# Patient Record
Sex: Male | Born: 1975 | Race: White | Hispanic: No | Marital: Married | Smoking: Former smoker
Health system: Southern US, Community
[De-identification: ages and names within clinical notes are randomized; demographics above are authoritative.]

## PROBLEM LIST (undated history)

## (undated) DIAGNOSIS — U071 COVID-19: Secondary | ICD-10-CM

## (undated) DIAGNOSIS — K760 Fatty (change of) liver, not elsewhere classified: Secondary | ICD-10-CM

## (undated) DIAGNOSIS — K219 Gastro-esophageal reflux disease without esophagitis: Secondary | ICD-10-CM

## (undated) HISTORY — DX: Fatty (change of) liver, not elsewhere classified: K76.0

## (undated) HISTORY — DX: COVID-19: U07.1

## (undated) HISTORY — PX: COLONOSCOPY: SHX174

## (undated) HISTORY — PX: VASECTOMY: SHX75

## (undated) HISTORY — DX: Gastro-esophageal reflux disease without esophagitis: K21.9

---

## 2001-01-02 ENCOUNTER — Emergency Department (HOSPITAL_COMMUNITY): Admission: EM | Admit: 2001-01-02 | Discharge: 2001-01-02 | Payer: Self-pay | Admitting: *Deleted

## 2008-06-11 ENCOUNTER — Ambulatory Visit: Payer: Self-pay | Admitting: Gastroenterology

## 2008-06-11 DIAGNOSIS — K625 Hemorrhage of anus and rectum: Secondary | ICD-10-CM | POA: Insufficient documentation

## 2008-06-14 ENCOUNTER — Encounter: Payer: Self-pay | Admitting: Gastroenterology

## 2008-06-14 ENCOUNTER — Ambulatory Visit: Payer: Self-pay | Admitting: Gastroenterology

## 2008-06-17 ENCOUNTER — Telehealth: Payer: Self-pay | Admitting: Gastroenterology

## 2008-06-20 ENCOUNTER — Encounter: Payer: Self-pay | Admitting: Gastroenterology

## 2008-07-05 ENCOUNTER — Ambulatory Visit: Payer: Self-pay | Admitting: Gastroenterology

## 2008-07-05 DIAGNOSIS — K5150 Left sided colitis without complications: Secondary | ICD-10-CM | POA: Insufficient documentation

## 2008-10-23 ENCOUNTER — Telehealth: Payer: Self-pay | Admitting: Gastroenterology

## 2008-10-24 ENCOUNTER — Ambulatory Visit: Payer: Self-pay | Admitting: Internal Medicine

## 2008-10-30 ENCOUNTER — Telehealth: Payer: Self-pay | Admitting: Gastroenterology

## 2008-11-13 ENCOUNTER — Ambulatory Visit: Payer: Self-pay | Admitting: Gastroenterology

## 2008-11-29 ENCOUNTER — Encounter: Payer: Self-pay | Admitting: Gastroenterology

## 2009-03-05 ENCOUNTER — Telehealth: Payer: Self-pay | Admitting: Gastroenterology

## 2009-03-18 ENCOUNTER — Ambulatory Visit: Payer: Self-pay | Admitting: Gastroenterology

## 2009-08-07 ENCOUNTER — Telehealth: Payer: Self-pay | Admitting: Gastroenterology

## 2010-05-19 NOTE — Medication Information (Signed)
Summary: Approved for [employer]da[employer]  Approved for [employer]   Imported By: Phillis Knack 12/05/2008 14:05:47  _____________________________________________________________________  External Attachment:    Type:   Image     Comment:   External Document

## 2010-05-19 NOTE — Procedures (Signed)
Summary: Colonoscopy   Colonoscopy  Procedure date:  06/14/2008  Findings:      Location:  [facility].    COLONOSCOPY PROCEDURE REPORT  PATIENT:  Edward Dougherty, Edward Dougherty  MR#:  [id number] BIRTHDATE:   February 09, 1976   GENDER:   male  ENDOSCOPIST:   Sandy Salaam. Deatra Ina, MD Referred by:   PROCEDURE DATE:  06/14/2008 PROCEDURE:  Colonoscopy with biopsy ASA CLASS:   Class I INDICATIONS: hematochezia   MEDICATIONS:    Fentanyl 75 mcg, Versed 8 mg  DESCRIPTION OF PROCEDURE:   After the risks benefits and alternatives of the procedure were thoroughly explained, informed consent was obtained.  Digital rectal exam was performed and revealed no abnormalities.   The LB [id number] endoscope was introduced through the anus and advanced to the cecum, which was identified by both the appendix and ileocecal valve, without limitations.  The quality of the prep was excellent, using MoviPrep.  The instrument was then slowly withdrawn as the colon was fully examined. <<PROCEDUREIMAGES>>                              <<OLD IMAGES>>  FINDINGS:  There were mucosal changes consistent with left-sided ulcerative colitis (see image11, image12, image13, image16, image17, image15, and image18). Moderately severe colitis with mucosal friability, superficial erosions, mucus extending 25cm from rectal vault to proximal sigmoid, Biopsies were taken  This was otherwise a normal examination (see image1, image2, image3, image4, image5, image6, image7, image8, and image10).   Retroflexion was not performed.  The scope was then withdrawn from the patient and the procedure completed.  COMPLICATIONS:   None  ENDOSCOPIC IMPRESSION:  1) Colitis - left UC  2) Otherwise normal examination RECOMMENDATIONS:  1) follow-up: office 3 week(s)  Lialda 2.4gm daily  Rowasa enemas Qam  cc Gerald:   No   _______________________________ Sandy Salaam. Deatra Ina,  MD  :      [facility] [address] [place], [place] [address] Telephone [phone] or [phone] Fax [phone]   REPORT OF SURGICAL PATHOLOGY   Case #: [id number] Patient Name: Edward Dougherty Office Chart Number:  [id number]   MRN: [id number] Pathologist: Chrystie Nose. 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      COMMENT The biopsies show increased infiltrate in the lamina propria associated with active neutrophilic cryptitis and abnormal crypt architecture.  Some of the crypts show slight nuclear stratification and increased numbers of mitotic figures consistent with reactive and inflammatory changes.  The findings are consistent with chronic active inflammatory bowel disease and the left sided distribution is compatible with ulcerative colitis.     (JDP:mw 06/18/08)    mw Date Reported:  06/18/2008     Chrystie Nose. Saralyn Pilar, MD *** Electronically Signed Out By JDP ***    June 20, 2008 MRN: [id number]    [address] [place], [place]  [address]    Dear Mr. Lanuza,  I am pleased to inform you that the biopsies taken during your recent colonoscopy did not show any evidence of cancer upon pathologic examination. Inflammatory changes only were seen.  Additional information/recommendations:  __No further action is needed at this time.  Please follow-up with  your primary care physician for your other healthcare needs.  __Please call [phone] to schedule a return visit to review      your condition.  _x_Continue with the treatment plan as outlined on the day of your      exam.  __You should have a repeat colonoscopy examination for this problem           in _ years.  Please call us if you are having  persistent problems or have questions about your condition that have not been fully answered at this time.  Sincerely,  Edward Castle MD   This letter has been electronically signed by your physician.    Signed by Edward Castle MD on 06/20/2008 at 11:28 AM  This report was created from the original endoscopy report, which was reviewed and signed by the above listed endoscopist.

## 2010-05-19 NOTE — Assessment & Plan Note (Signed)
Summary: f/u .Marland Kitchen..em   History of Present Illness Visit Type: Follow-up Visit Primary GI MD: Erskine Emery MD [facility] Primary Provider: Fraser Din, MD  Chief Complaint: Colitis has greatly improved History of Present Illness:   Edward Dougherty returns for followup of his left-sided colitis.  He feels that he is slowly improving.  For financial reasons he did not purchased ProctoFoam or Rowasa.  He typically sleeps through the night 70% of the time.  He may awaken with urgency.  In the course of the day he may have 3-4 bowel movements which can be loose or well-formed.  He has a minimal amount of bleeding.  He is under stress due  to his unemployment.  Altogether, however, he feels that he is better.   GI Review of Systems    Reports belching.      Denies abdominal pain, acid reflux, bloating, chest pain, dysphagia with liquids, dysphagia with solids, heartburn, loss of appetite, nausea, vomiting, vomiting blood, weight loss, and  weight gain.      Reports rectal bleeding.     Denies anal fissure, black tarry stools, change in bowel habit, constipation, diarrhea, diverticulosis, fecal incontinence, heme positive stool, hemorrhoids, irritable bowel syndrome, jaundice, light color stool, liver problems, and  rectal pain.    Current Medications (verified): 1)  Prilosec Otc 20 Mg  Tbec (Omeprazole Magnesium) .Marland Kitchen.. 1 Each Day 30 Minutes Before Meal 2)  Lialda 1.2 Gm  Tbec (Mesalamine) .... Take 2 Tablets By Mouth Two Times A Day 3)  Proctofoam 1 % Foam (Pramoxine Hcl) .... Take Q.a.m.  Allergies (verified): 1)  Pcn  Past History:  Past Surgical History: Reviewed history from 06/11/2008 and no changes required. Unremarkable  Family History: Reviewed history from 07/05/2008 and no changes required. Family History of Diabetes: Mother, Father No FH of Colon Cancer:  Social History: Reviewed history from 06/11/2008 and no changes required. 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 Quit 10 years ago Alcohol Use - yes- few times a month Daily Caffeine Use 3-4 times week Illicit Drug Use - no  Review of Systems       The patient complains of allergy/sinus, cough, fatigue, itching, and skin rash.    Vital Signs:  Patient profile:   35 year old male Height:      72 inches Weight:      202.38 pounds BMI:     27.55 Pulse rate:   80 / minute Pulse rhythm:   regular BP sitting:   110 / 82  (left arm) Cuff size:   regular  Vitals Entered By: June McMurray CMA Deborra Medina) (March 18, 2009 10:26 AM)   Impression & Recommendations:  Problem # 1:  LEFT SIDED ULCERATIVE COLITIS (ICD-556.5) Assessment Improved Patient will continue lialda.  I have recommended that he take Imodium 2 tabs q.h.s. and then p.r.n.  He noticed a slight rash under his eyes and forehead that may have improved when he held his lialda.  Since the rash is minimal he will continue with this medicine.  Patient Instructions: 1)  cc  Edward Hey, MD

## 2010-05-19 NOTE — Progress Notes (Signed)
Summary: REV SCHEDULED  Phone Note Outgoing Call   Call placed by: Vivia Ewing LPN,  June 17, 5641 12:12 PM Call placed to: Patient Summary of Call: Pt. scheduled for REV with Dr.Kaplan on 07-15-08 at 9:45am. Meds. sent to pharmacy. Pt. instructed to call back as needed.  Initial call taken by: Vivia Ewing LPN,  June 17, 3293 12:12 PM    New/Updated Medications: LIALDA 1.2 GM  TBEC (MESALAMINE) Take 2 tabs once daily MESALAMINE 4 GM ENEM (MESALAMINE) use one every morning   Prescriptions: MESALAMINE 4 GM ENEM (MESALAMINE) use one every morning  #30 x 2   Entered by:   Vivia Ewing LPN   Authorized by:   [name] MD   Signed by:   Vivia Ewing LPN on 18/84/1660   Method used:   Electronically to        [facility] (retail)       [address]       [place], [place]  [address]       Ph: [phone]       Fax: [phone]   RxID:   [id number] LIALDA 1.2 GM  TBEC (MESALAMINE) Take 2 tabs once daily  #60 x 6   Entered by:   Vivia Ewing LPN   Authorized by:   [name] MD   Signed by:   Vivia Ewing LPN on 73/71/0626   Method used:   Electronically to        [facility] (retail)       [address]       [place], [place]  [address]       Ph: [phone]       Fax: [phone]   RxID:   [id number]

## 2010-05-19 NOTE — Progress Notes (Signed)
Summary: TRIAGE-UC FLARE  Phone Note Call from Patient Call back at Home Phone [phone]   Caller: Patient Call For: KAPLAN  Reason for Call: Talk to Nurse Summary of Call: having UC flare-ups meds not strong enough  Initial call taken by: Quenton Fetter [facility],  October 23, 2008 1:29 PM  Follow-up for Phone Call        Pt. has UC.  Pt. has been having a flare-up, off and on since 06/2008. He restarted the Mesalamine Enemas, went through 32 enemas. After a few weeks of the enemas and Lialda 2 QAM, he noted more formed stools and no blood. Now he is in another flare for about 2-3 weeks.  Currently he is having loose to watery stools, worse during the night and early mornings. Sees blood & mucus with BM's. Some abd. pain/pressure.  1) Clear liquids x24 hours. Advance to soft,bland diet x2-4 days. Advanced as tolerated. 2) See Tye Savoy NP on 10-24-08 at 10am 3) Pt. instructed to call back as needed.   Follow-up by: Vivia Ewing LPN,  October 23, 8240 3:53 PM  Additional Follow-up for Phone Call Additional follow up Details #1::        ok Additional Follow-up by: [name] MD,  October 23, 2008 4:48 PM

## 2010-05-19 NOTE — Letter (Signed)
Summary: Patient Notice- Colon Biospy Results  [facility]  [address] [place], [place] [address]   Phone: [phone]  Fax: [phone]        June 20, 2008 MRN: [id number]    Edward Dougherty [place], [place]  [address]    Dear [name],  I am pleased to inform you that the biopsies taken during your recent colonoscopy did not show any evidence of cancer upon pathologic examination. Inflammatory changes only were seen.  Additional information/recommendations:  __No further action is needed at this time.  Please follow-up with      your primary care physician for your other healthcare needs.  __Please call [phone] to schedule a return visit to review      your condition.  _x_Continue with the treatment plan as outlined on the day of your      exam.  __You should have a repeat colonoscopy examination for this problem           in _ years.  Please call us if you are having persistent problems or have questions about your condition that have not been fully answered at this time.  Sincerely,  [name] MD   This letter has been electronically signed by your physician.

## 2010-05-19 NOTE — Progress Notes (Signed)
Summary: CONDITION UPDATE  Phone Note Call from Patient Call back at Home Phone [phone]   Caller: Patient Call For: KAPLAN  Reason for Call: Talk to Nurse Summary of Call: meds are not working  Initial call taken by: Quenton Fetter [facility],  October 30, 2008 9:56 AM  Follow-up for Phone Call        Pt. was seen 10-24-08 and has been taking Lialda #4 daily. Continues with abd. pressure, loose to watery stools, 10-12 stools daily,  passes only gas/blood at times. Blood and mucus with every BM. Denies n/v, fever.   DR.KAPLAN PLEASE ADVISE   Follow-up by: Vivia Ewing LPN,  October 30, 8869 9:59 PM  Additional Follow-up for Phone Call Additional follow up Details #1::        Cort enemas qam Rowasa enema at bedtime c/b mon if no better ov 1-2wks Additional Follow-up by: [name] MD,  October 31, 2008 9:35 AM    Additional Follow-up for Phone Call Additional follow up Details #2::    Message left for pt. with above MD instructions. Meds. to pharmacy. Pt. instructed to call back as needed.   Follow-up by: Vivia Ewing LPN,  October 31, 7469 8:55 AM  New/Updated Medications: MESALAMINE 4 GM ENEM (MESALAMINE) use one every night at bedtime CORTENEMA 100 MG/60ML ENEM (HYDROCORTISONE) Use 1 every morning Prescriptions: CORTENEMA 100 MG/60ML ENEM (HYDROCORTISONE) Use 1 every morning  #30 x 1   Entered by:   Vivia Ewing LPN   Authorized by:   [name] MD   Signed by:   Vivia Ewing LPN on 01/58/6825   Method used:   Electronically to        [facility] (retail)       [address]       [place], [place]  [address]       Ph: [phone]       Fax: [phone]   RxID:   [id number] MESALAMINE 4 GM ENEM (MESALAMINE) use one every night at bedtime  #30 x 1   Entered by:   Vivia Ewing LPN   Authorized by:   [name] MD   Signed by:   Vivia Ewing LPN on 88/64/8472   Method used:   Electronically to        [facility] (retail)       [address]       [place], [place]  [address]       Ph: [phone]       Fax: [phone]   RxID:   [id number]

## 2010-05-19 NOTE — Progress Notes (Signed)
Summary: refill  Phone Note Call from Patient Call back at Home Phone [phone]   Caller: Patient Call For: Dr. Deatra Ina Reason for Call: Talk to Nurse Summary of Call: no more refills for Lialda... wants to know if he needs to be seen in office before he can get a refill... wants to know if dosage needs to be adjusted, although pt has no complaints and says meds are really helping...  [facility] in [place] Initial call taken by: Lucien Mons,  August 07, 2009 9:40 AM  Follow-up for Phone Call        Called pt to inform he was seen in Nov. 2010 gave pt 6 refills told him to schedule office appointment before November for any further refills. Pt had no GI complaints Follow-up by: Genella Mech CMA Deborra Medina),  August 07, 2009 10:37 AM    Prescriptions: LIALDA 1.2 GM  TBEC (MESALAMINE) Take 2 tablets by mouth two times a day  #120 x 6   Entered by:   Genella Mech CMA ([place])   Authorized by:   [name] MD   Signed by:   Genella Mech CMA ([place]) on 08/07/2009   Method used:   Electronically to        [facility] (retail)       [address]       [place],   [address]       Ph: [phone]       Fax: [phone]   RxID:   [id number]

## 2010-05-19 NOTE — Progress Notes (Signed)
Summary: ? re refills  Phone Note Call from Patient Call back at Home Phone [phone]   Caller: Patient Call For: [facility] Reason for Call: Talk to Nurse Summary of Call: Patient has questions regarding his refills on Lialda Initial call taken by: Ronalee Red,  March 05, 2009 9:54 AM  Follow-up for Phone Call        [facility] to verify Lialda refills per pts request. called pt to inform that I called [place] Follow-up by: Genella Mech CMA Deborra Medina),  March 05, 2009 10:31 AM

## 2010-05-19 NOTE — Assessment & Plan Note (Signed)
Summary: ULCERATIVE COLITIS FLARE             (DR.KAPLAN PT)    Edward Dougherty   History of Present Illness Visit Type: Follow-up Visit Primary GI MD: Erskine Emery MD [facility] Primary Nikkole Placzek: Ricke Hey, MD Chief Complaint: Ulcerative colitis flaring now History of Present Illness:   Edward Dougherty was diagnosed with left sided UC in Feb. 2010. He was initially on Lialda and Rowasa enemas but at his last follow-up in March Rowasa was stopped because patient was doing much better.  After March appt. symptoms gradually returned.  In May patient restarted himself on Rowasa enemas. Symptomsresolved and in mid June he stopped Rowasa enemas because symptoms resolved. Since being off Rowasa again [name]'s symptoms gave gradully returned. Stools are not necessarily loose but he is having tenesmus and over the last few days stools have been mucoid and blood tinged. No fever. No abdominal pain. No joint aches. No skin rashes except around mouth where he had been eating Mangos.     GI Review of Systems      Denies abdominal pain, acid reflux, belching, bloating, chest pain, dysphagia with liquids, dysphagia with solids, heartburn, loss of appetite, nausea, vomiting, vomiting blood, weight loss, and  weight gain.      Reports rectal bleeding.     Denies anal fissure, black tarry stools, change in bowel habit, constipation, diarrhea, diverticulosis, fecal incontinence, heme positive stool, hemorrhoids, irritable bowel syndrome, jaundice, light color stool, liver problems, and  rectal pain.    Current Medications (verified): 1)  Prilosec Otc 20 Mg  Tbec (Omeprazole Magnesium) .Marland Kitchen.. 1 Each Day 30 Minutes Before Meal 2)  Lialda 1.2 Gm  Tbec (Mesalamine) .... Take 2 Tabs Once Daily 3)  Mesalamine 4 Gm Enem (Mesalamine) .... Use One Every Morning  Allergies (verified): 1)  Pcn  Past History:  Past Medical History: Ulcerative Colitis  Past Surgical History: Reviewed history from 06/11/2008 and no changes  required. Unremarkable  Family History: Reviewed history from 07/05/2008 and no changes required. Family History of Diabetes: Mother, Father No FH of Colon Cancer:  Social History: Reviewed history from 06/11/2008 and no changes required. 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  The patient denies anemia, anxiety-new, arthritis/joint pain, back pain, blood in urine, breast changes/lumps, change in vision, confusion, cough, coughing up blood, depression-new, fainting, fever, headaches-new, hearing problems, heart murmur, heart rhythm changes, itching, muscle pains/cramps, night sweats, nosebleeds, pregnancy symptoms, shortness of breath, sleeping problems, sore throat, swelling of feet/legs, swollen lymph glands, thirst - excessive , urination - excessive , urination changes/pain, urine leakage, vision changes, and voice change.    Vital Signs:  Patient profile:   35 year old male Height:      72 inches Weight:      207.38 pounds BMI:     28.23 Pulse rate:   72 / minute Pulse rhythm:   regular BP sitting:   120 / 82  (left arm) Cuff size:   regular  Vitals Entered By: June McMurray Stow (October 24, 2008 10:10 AM)  Physical Exam  General:  Well developed, well nourished, no acute distress. Head:  Normocephalic and atraumatic. Eyes:  Conjunctiva pink, no icterus.  Mouth:  No oral lesions. Tongue moist.  Neck:  Supple; no masses. Lungs:  Clear throughout to auscultation. Heart:  Regular rate and rhythm; no murmurs, rubs,  or bruits. Abdomen:  Abdomen soft, nontender, nondistended. No obvious masses or hepatomegaly. No obvious hernias. Normal bowel sounds.  Msk:  Symmetrical with no gross deformities. Normal posture. Extremities:  No palmar  erythema, no edema.  Neurologic:  Alert and  oriented x4;  grossly normal neurologically. Skin:  Intact without significant lesions or rashes. Cervical Nodes:  No significant cervical adenopathy. Psych:  Alert and cooperative. Normal mood and affect.   Impression & Recommendations:  Problem # 1:  LEFT SIDED ULCERATIVE COLITIS (ICD-556.5) Assessment Deteriorated On Lialda 2.4 gm daily. Gradual worsening of symptoms since last visit so restarted Rowasa in May. Got better. Stopped  enemas mid June after resolution of symptoms. Now again having gradual worsening of symptoms. Not on maximum dose of Lialda so will increase that to 4.8gms daily. If no improvement in 3-4 days will restart Rowasa enemas. Patient will return to see Dr. Deatra Ina in couple of weeks at which time maintainence medications will be established.  Patient Instructions: 1)  We sent prescription electronically for the [place]. You will take 4 tab daily. 2)  We made you an appointment to see Dr. Deatra Ina on 11-13-08 at 10 :15AM.  3)  Copy sent to : Ricke Hey, MD 4)  The medication list was reviewed and reconciled.  All changed / newly prescribed medications were explained.  A complete medication list was provided to the patient / caregiver. Prescriptions: LIALDA 1.2 GM  TBEC (MESALAMINE) Take 4 tabs once daily  #120 x 3   Entered by:   Marisue Humble CMA   Authorized by:   Tye Savoy NP   Signed by:   Marisue Humble CMA on 10/24/2008   Method used:   Electronically to        [facility] (retail)       [address]       [place], [place]  [address]       Ph: [phone]       Fax: [phone]   RxID:   [id number]

## 2010-05-19 NOTE — Assessment & Plan Note (Signed)
Summary: F/U Ulcerative colitis/pp   History of Present Illness Visit Type: Follow-up Visit Primary GI MD: Erskine Emery MD [facility] Primary Provider: Fraser Din, MD  Chief Complaint: Still having blood in stool occ. Feels the need to go to restroom and still passing puss. Not waking in the night for have a BM anymore. He is having about 2 BM per day. No abd pain. Edward Dougherty returns for followup of his left-sided colitis.  Symptoms are slowly improving on a regimen of Rowasa enemas q.h.s. and lialda.  He no longer is awakening at night to move his bowels.  He may have 2-3 solid bowel movements a day and a couple of bowel movements consisting of small amounts of pus.  Urgency has significantly decreased.   GI Review of Systems      Denies abdominal pain, acid reflux, belching, bloating, chest pain, dysphagia with liquids, dysphagia with solids, heartburn, loss of appetite, nausea, vomiting, vomiting blood, weight loss, and  weight gain.      Reports heme positive stool and  rectal bleeding.     Denies anal fissure, black tarry stools, change in bowel habit, constipation, diarrhea, diverticulosis, fecal incontinence, hemorrhoids, irritable bowel syndrome, jaundice, light color stool, liver problems, and  rectal pain.    Current Medications (verified): 1)  Prilosec Otc 20 Mg  Tbec (Omeprazole Magnesium) .Marland Kitchen.. 1 Each Day 30 Minutes Before Meal 2)  Lialda 1.2 Gm  Tbec (Mesalamine) .... 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 Quit 10 years ago Alcohol Use - yes- few times a month Daily  Caffeine Use 3-4 times week Illicit Drug Use - no  Review of Systems  The patient denies allergy/sinus, anemia, anxiety-new, arthritis/joint pain, back pain, blood in urine, breast changes/lumps, change in vision, confusion, cough, coughing up blood, depression-new, fainting, fatigue, fever, headaches-new, hearing problems, heart murmur, heart rhythm changes, itching, muscle pains/cramps, night sweats, nosebleeds, shortness of breath, skin rash, sleeping problems, sore throat, swelling of feet/legs, swollen lymph glands, thirst - excessive, urination - excessive, urination changes/pain, urine leakage, vision changes, and voice change.    Vital Signs:  Patient profile:   35 year old male Height:      72 inches Weight:      203.2 pounds BMI:     27.66 Pulse rate:   72 / minute Pulse rhythm:   regular BP sitting:   118 / 78  (left arm) Cuff size:   regular  Vitals Entered By: Bernita Buffy CMA Deborra Medina) (November 13, 2008 10:43 AM)   Impression & Recommendations:  Problem # 1:  LEFT SIDED ULCERATIVE COLITIS (ICD-556.5) Assessment Improved He has had a fairly good but slow response to medicines.  Recommendations #1 continue Rowasa enemas for 2-3 more weeks while adding ProctoFoam q.a.m. #2 continue lialda  Patient Instructions: 1)  cc Edward Hey, MD Prescriptions: ROWASA 4 GM KIT (MESALAMINE-CLEANSER) take one PR q.h.s.  #14 x 2   Entered and Authorized by:   [name] MD   Signed by:   [name] MD on 11/13/2008   Method used:   Electronically to        [facility]  [address] (retail)       [address]       [place], [place]  [address]       Ph: [phone]       Fax: [phone]   RxID:   [id number] PROCTOFOAM 1 % FOAM (PRAMOXINE HCL) take q.a.m.  #15 x 2   Entered and Authorized by:   [name] MD   Signed by:   [name] MD on 11/13/2008   Method used:   Electronically to        [facility] (retail)        [address]       [place], [place]  [address]       Ph: [phone]       Fax: [phone]   RxID:   [id number]

## 2010-05-19 NOTE — Assessment & Plan Note (Signed)
Summary: RECTAL BLEED/YF   History of Present Illness Visit Type: new patient Primary GI MD: Erskine Emery MD [facility] Primary Provider: [facility] Chief Complaint: rectal bleeding History of Present Illness:   Edward Dougherty is a pleasant 35 year old white male referred from the [facility] for evaluation of rectal bleeding.  For several weeks he has been complaining of painless bright red blood per rectum.  Blood may be mixed with solid or liquid stools.  He denies abdominal or rectal pain.  At times when he has felt gaseous he has passed some loose stools.  He has no prior history of rectal bleeding.  He denies melena.  He has taken hemorrhoid suppositories for over one week without improvement in the bleeding.   GI Review of Systems    Reports bloating and  heartburn.      Denies abdominal pain, acid reflux, belching, chest pain, dysphagia with liquids, dysphagia with solids, loss of appetite, nausea, vomiting, vomiting blood, weight loss, and  weight gain.      Reports rectal bleeding.     Denies anal fissure, black tarry stools, change in bowel habit, constipation, diarrhea, diverticulosis, fecal incontinence, heme positive stool, hemorrhoids, irritable bowel syndrome, jaundice, light color stool, liver problems, and  rectal pain.   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 Quit 10 years ago    Alcohol Use - yes- few times a month    Daily Caffeine Use 3-4  times week    Illicit Drug Use - no  Risk Factors:  Tobacco use:  quit Drug use:  no Alcohol use:  yes  Review of Systems       The patient complains of allergy/sinus, fatigue, itching, skin rash, sleeping problems, and urination - excessive.  The patient denies anemia, anxiety-new, arthritis/joint pain, back pain, blood in urine, breast changes/lumps, change in vision, confusion, cough, coughing up blood, depression-new, fever, headaches-new, hearing problems, heart murmur, heart rhythm changes, menstrual pain, muscle pains/cramps, night sweats, nosebleeds, pregnancy symptoms, shortness of breath, sore throat, swelling of feet/legs, swollen lymph glands, thirst - excessive , urination - excessive , urination changes/pain, urine leakage, vision changes, and voice change.         Review of systems is otherwise negative   Vital Signs:  Patient Profile:   35 Years Old Male Height:     72 inches Weight:      213 pounds BMI:     28.99 Pulse rate:   88 / minute Pulse rhythm:   regular BP sitting:   102 / 70  (left arm) Cuff size:   regular  Vitals Entered By: Edward Dougherty CMA (June 11, 2008 4:00 PM)                  Physical Exam  He is a healthy-appearing male  skin: anicter  HEENT: normocephalic; PEERLA; no nasal or orpharyngeal abnormalities neck: supple nodes: no cervical adenopathy chest: clear cor:  no  murmurs, gallops or rubs abd:  bowel sounds normoactive; no abdominal masses, tenderness, organomegaly rectal: no masses; stool heme negative; there is no stool in the rectal vault ext: no cyanosis, clubbing, or edema skeletal: no gross skeletal abnormalities neuro: alert, oriented x 3; no focal abnormalities    Impression & Recommendations:  Problem # 1:  HEMORRHAGE OF RECTUM AND ANUS (ICD-569.3) Patient is rectal bleeding  most likely due to hemorrhoids.  Must also be considered.  Recommendations #1 colonoscopy.  If hemorrhoids only are seen I  would consider band ligation  since symptoms have persisted despite therapy with suppositories. Orders: Colonoscopy (Colon)  Orders: Colonoscopy (Colon)    Patient Instructions: 1)  Colonoscopy and Flexible Sigmoidoscopy brochure given. 2)  Conscious Sedation brochure given. 3)  Your Procedure is scheduled for 06/14/2008 at 3:00pm 4)  Your Movi- prep will be sent to your pharmacy today 5)  cc Dr. Alyson Ingles - [place] [employer]  Prescriptions: MOVIPREP 100 GM  SOLR (PEG-KCL-NACL-NASULF-NA ASC-C) As per prep instructions.  #1 x 0   Entered by:   Genella Mech CMA   Authorized by:   [name] MD   Signed by:   Genella Mech CMA on 06/11/2008   Method used:   Electronically to        [facility] (retail)       [address]       [place], [place]  [address]       Ph: [phone]       Fax: [phone]   RxID:   [id number]

## 2010-05-22 ENCOUNTER — Telehealth: Payer: Self-pay | Admitting: Gastroenterology

## 2010-05-25 ENCOUNTER — Ambulatory Visit: Payer: Self-pay | Admitting: Gastroenterology

## 2010-05-25 ENCOUNTER — Telehealth: Payer: Self-pay | Admitting: Gastroenterology

## 2010-05-27 NOTE — Progress Notes (Signed)
Summary: Triage  Phone Note Call from Patient Call back at Home Phone [phone]   Caller: Patient Call For: Dr. Deatra Ina Reason for Call: Talk to Nurse Summary of Call: pt. was on the Bumped list for 06-12-10 for a f/u and med refills...Marland KitchenMarland Kitchenthere are no appts. avail. at this time and he is requesting a sooner appt. Initial call taken by: Webb Laws,  May 22, 2010 3:23 PM  Follow-up for Phone Call        Spoke with patient and offered him an appointment for 05/25/10 @2 :45pm. Patient aware of appointment date and time. Follow-up by: Rosanne Sack RN,  May 22, 2010 4:06 PM

## 2010-06-04 NOTE — Progress Notes (Signed)
Summary: Med Refill  Phone Note Call from Patient Call back at Home Phone [phone]   Caller: Patient Call For: Dr. Deatra Ina Reason for Call: Refill Medication Summary of Call: Had an appt. today and had to r/s until 05-25-10 because he had an emergency and has to fly out today.....Marland KitchenMarland KitchenNeeds his Lialda refilled.Marland KitchenMarland KitchenNext Appointment: 07-07-2010[facility] Initial call taken by: Webb Laws,  May 25, 2010 8:09 AM  Follow-up for Phone Call        Medication sent Follow-up by: Genella Mech CMA Deborra Medina),  May 25, 2010 8:13 AM    Prescriptions: LIALDA 1.2 GM  TBEC (MESALAMINE) Take 2 tablets by mouth two times a day  #120 x 3   Entered by:   Genella Mech CMA ([place])   Authorized by:   [name] MD   Signed by:   Genella Mech CMA ([place]) on 05/25/2010   Method used:   Electronically to        [facility] (retail)       [address]       [place], [place]  [address]       Ph: [phone]       Fax: [phone]   RxID:   [id number]   Appended Document: Med Refill    Clinical Lists Changes  Medications: Rx of LIALDA 1.2 GM  TBEC (MESALAMINE) Take 2 tablets by mouth two times a day;  #120 x 3;  Signed;  Entered by: Genella Mech CMA (AAMA);  Authorized by: [name] MD;  Method used: Faxed to [facility], [address], [place], [place]  [address], Ph: [phone], Fax: [phone]    Prescriptions: LIALDA 1.2 GM  TBEC (MESALAMINE) Take 2 tablets by mouth two times a day  #120 x 3   Entered by:   Genella Mech CMA ([place])   Authorized by:   [name] MD   Signed by:   Genella Mech CMA ([place]) on 05/25/2010   Method used:   Faxed to ...       [facility] (retail)       [address] [place], [place]  [address]       Ph: [phone]       Fax: [phone]   RxID:   [id number]   1ST RX SENT TO [facility]

## 2010-06-12 ENCOUNTER — Ambulatory Visit: Payer: Self-pay | Admitting: Gastroenterology

## 2010-07-07 ENCOUNTER — Encounter: Payer: Self-pay | Admitting: Gastroenterology

## 2010-07-07 ENCOUNTER — Ambulatory Visit: Payer: Self-pay | Admitting: Gastroenterology

## 2010-07-07 ENCOUNTER — Other Ambulatory Visit: Payer: Managed Care, Other (non HMO)

## 2010-07-07 ENCOUNTER — Other Ambulatory Visit: Payer: Self-pay | Admitting: Gastroenterology

## 2010-07-07 ENCOUNTER — Ambulatory Visit (INDEPENDENT_AMBULATORY_CARE_PROVIDER_SITE_OTHER): Payer: Managed Care, Other (non HMO) | Admitting: Gastroenterology

## 2010-07-07 VITALS — BP 128/76 | HR 88 | Ht 72.0 in | Wt 206.0 lb

## 2010-07-07 DIAGNOSIS — K602 Anal fissure, unspecified: Secondary | ICD-10-CM | POA: Insufficient documentation

## 2010-07-07 DIAGNOSIS — K5150 Left sided colitis without complications: Secondary | ICD-10-CM

## 2010-07-07 LAB — HEPATIC FUNCTION PANEL
ALT: 19 U/L (ref 0–53)
AST: 20 U/L (ref 0–37)
Albumin: 4.4 g/dL (ref 3.5–5.2)
Alkaline Phosphatase: 68 U/L (ref 39–117)
Bilirubin, Direct: 0 mg/dL (ref 0.0–0.3)
Total Bilirubin: 0.4 mg/dL (ref 0.3–1.2)
Total Protein: 7 g/dL (ref 6.0–8.3)

## 2010-07-07 MED ORDER — HYDROCORTISONE ACETATE 25 MG RE SUPP: 25.0000 mg | RECTAL | Status: AC

## 2010-07-07 NOTE — Telephone Encounter (Signed)
Returned pharmacy's call, instructed per Dr Deatra Ina to give pt 1 qhs for 14 days

## 2010-07-07 NOTE — Assessment & Plan Note (Signed)
He remains in clinical remission.  Medications #1 continually although  #2 check LFTs and CBC yearly

## 2010-07-07 NOTE — Assessment & Plan Note (Signed)
Patient's rectal pain is due to the fissure.  Recommendations #1 Anusol HC suppositories. #2 warm soaks

## 2010-07-07 NOTE — Progress Notes (Signed)
Edward Dougherty has returned for followup of his left-sided ulcerative colitis. This was diagnosed in 2010. He is maintained on lialda.   On this regimen he has done well. He has 1-2 fairly solid bowel movements a day. His main complaint is rectal pain. This usually occurs following a bowel movement. He has had no bleeding.  Past Medical History  Diagnosis Date  . Ulcerative colitis   . GERD (gastroesophageal reflux disease)    Past Surgical History  Procedure Date  . Vasectomy     reports that he has never smoked. He does not have any smokeless tobacco history on file. He reports that he does not use illicit drugs. His alcohol history not on file. family history includes Diabetes in his father and mother. Allergies  Allergen Reactions  . Penicillins    All other systems were reviewed and were negative   Vital signs from this visit reviewed General Well developed, well nourished  Skin No rashes; anicteric  HEENT No pharyngeal abnormalities; pupils equal  Neck No masses, thyroidomegaly  Chest Clear to auscultation  Cardiac No murmus, gallops, rubs  Abdomen BS active; no masses, tenderness, organomegaly  Rectal No masses;  There is an anal fissure  Extremities  no cyanosis, clubbing, edema  Skeletal No deformities  Neuro Alert; no focal abnormalities

## 2010-07-07 NOTE — Patient Instructions (Signed)
You will go to the basement for labs today

## 2010-08-03 ENCOUNTER — Telehealth: Payer: Self-pay | Admitting: Gastroenterology

## 2010-08-04 NOTE — Telephone Encounter (Signed)
Pt called and stated [place] was [place] of the country so much recently, he would like another script for the Anusol suppositories. Please call to [facility], [phone] Per order, Dr Deatra Ina had ordered 2 refills on the suppositories. Called the pharmacy and ordered a refill. Informed pt.

## 2010-09-10 ENCOUNTER — Other Ambulatory Visit: Payer: Self-pay

## 2010-09-10 MED ORDER — MESALAMINE 1.2 G PO TBEC: DELAYED_RELEASE_TABLET | ORAL | Status: DC

## 2011-03-23 ENCOUNTER — Ambulatory Visit (INDEPENDENT_AMBULATORY_CARE_PROVIDER_SITE_OTHER): Payer: Managed Care, Other (non HMO) | Admitting: Gastroenterology

## 2011-03-23 ENCOUNTER — Encounter: Payer: Self-pay | Admitting: Gastroenterology

## 2011-03-23 ENCOUNTER — Other Ambulatory Visit (INDEPENDENT_AMBULATORY_CARE_PROVIDER_SITE_OTHER): Payer: Managed Care, Other (non HMO)

## 2011-03-23 DIAGNOSIS — K5190 Ulcerative colitis, unspecified, without complications: Secondary | ICD-10-CM

## 2011-03-23 DIAGNOSIS — K5150 Left sided colitis without complications: Secondary | ICD-10-CM

## 2011-03-23 LAB — CBC WITH DIFFERENTIAL/PLATELET
Basophils Absolute: 0 10*3/uL (ref 0.0–0.1)
Basophils Relative: 0.3 % (ref 0.0–3.0)
Eosinophils Absolute: 0.2 10*3/uL (ref 0.0–0.7)
Eosinophils Relative: 2.3 % (ref 0.0–5.0)
HCT: 43.6 % (ref 39.0–52.0)
Hemoglobin: 14.7 g/dL (ref 13.0–17.0)
Lymphocytes Relative: 22.9 % (ref 12.0–46.0)
Lymphs Abs: 1.7 10*3/uL (ref 0.7–4.0)
MCHC: 33.7 g/dL (ref 30.0–36.0)
MCV: 89.4 fl (ref 78.0–100.0)
Monocytes Absolute: 0.6 10*3/uL (ref 0.1–1.0)
Monocytes Relative: 8.4 % (ref 3.0–12.0)
Neutro Abs: 4.9 10*3/uL (ref 1.4–7.7)
Neutrophils Relative %: 66.1 % (ref 43.0–77.0)
Platelets: 270 10*3/uL (ref 150.0–400.0)
RBC: 4.87 Mil/uL (ref 4.22–5.81)
RDW: 13.6 % (ref 11.5–14.6)
WBC: 7.4 10*3/uL (ref 4.5–10.5)

## 2011-03-23 LAB — HEPATIC FUNCTION PANEL
ALT: 29 U/L (ref 0–53)
AST: 24 U/L (ref 0–37)
Albumin: 4.3 g/dL (ref 3.5–5.2)
Alkaline Phosphatase: 75 U/L (ref 39–117)
Bilirubin, Direct: 0 mg/dL (ref 0.0–0.3)
Total Bilirubin: 0.7 mg/dL (ref 0.3–1.2)
Total Protein: 7.4 g/dL (ref 6.0–8.3)

## 2011-03-23 NOTE — Progress Notes (Signed)
History of Present Illness:  Edward Dougherty has returned for a followup of left-sided ulcerative colitis. This was diagnosed in 2010. On a regimen of lialda 2.4 g today he's done well. He's had only minor shortlived flareups.    Review of Systems: Pertinent positive and negative review of systems were noted in the above HPI section. All other review of systems were otherwise negative.    Current Medications, Allergies, Past Medical History, Past Surgical History, Family History and Social History were reviewed in [facility] record  Vital signs were reviewed in today's medical record. Physical Exam: General: Well developed , well nourished, no acute distress

## 2011-03-23 NOTE — Patient Instructions (Signed)
You will go to the basement for labs today You will need labs again in 6 months and 1 year and follow up in 1 year

## 2011-03-23 NOTE — Assessment & Plan Note (Signed)
He remains in clinical remission on lialda. Plan to continue with the same.

## 2011-05-06 ENCOUNTER — Telehealth: Payer: Self-pay | Admitting: Gastroenterology

## 2011-05-06 NOTE — Telephone Encounter (Signed)
It doesn't matter which antibiotic but would try to avoid clindamycin.  It's up to her dentist.

## 2011-05-06 NOTE — Telephone Encounter (Signed)
Pt had his wisdom teeth removed and was not given an antibiotic due to his h/o ulcerative colitis. Pt is starting to feel warm and wants to know which antibiotics would be ok for him to take if he needs to. Dr. Deatra Ina please advise.

## 2011-05-06 NOTE — Telephone Encounter (Signed)
Spoke with pt and he is aware.

## 2011-05-11 ENCOUNTER — Encounter: Payer: Self-pay | Admitting: Internal Medicine

## 2011-05-27 ENCOUNTER — Ambulatory Visit: Payer: Managed Care, Other (non HMO) | Admitting: Internal Medicine

## 2011-06-04 ENCOUNTER — Ambulatory Visit (INDEPENDENT_AMBULATORY_CARE_PROVIDER_SITE_OTHER): Payer: Medicare HMO | Admitting: Internal Medicine

## 2011-06-04 ENCOUNTER — Encounter: Payer: Self-pay | Admitting: Internal Medicine

## 2011-06-04 VITALS — BP 124/82 | HR 80 | Ht 72.0 in | Wt 212.0 lb

## 2011-06-04 DIAGNOSIS — R0602 Shortness of breath: Secondary | ICD-10-CM

## 2011-06-04 NOTE — Progress Notes (Signed)
HPI Patient is a 36 year old who was referred for cardiac risk stratification The patient says when he was in his 59s he was very active.  Did not have any problems.   OVer the past few years he has noted a gradual decline in his exercise tolerance.  He gets SOB with activity.  Also finds that he sweats profusely.  He denies CP.  No PND> Allergies  Allergen Reactions  . Penicillins     Current Outpatient Prescriptions  Medication Sig Dispense Refill  . mesalamine (LIALDA) 1.2 G EC tablet Four tablets by mouth in the morning       . Multiple Vitamin (MULTIVITAMIN) tablet Take 1 tablet by mouth daily.      Marland Kitchen omeprazole (PRILOSEC) 20 MG capsule Take 20 mg by mouth daily.          Past Medical History  Diagnosis Date  . Ulcerative colitis   . GERD (gastroesophageal reflux disease)     Past Surgical History  Procedure Date  . Vasectomy     Family History  Problem Relation Age of Onset  . Diabetes Mother   . Diabetes Father   . Colon cancer Neg Hx     History   Social History  . Marital Status: Married    Spouse Name: N/A    Number of Children: N/A  . Years of Education: N/A   Occupational History  . Employed     Designer, multimedia    Social History Main Topics  . Smoking status: Former Smoker    Quit date: 04/19/1998  . Smokeless tobacco: Never Used  . Alcohol Use: No  . Drug Use: No  . Sexually Active: Not on file   Other Topics Concern  . Not on file   Social History Narrative  . No narrative on file    Review of Systems:  All systems reviewed.  They are negative to the above problem except as previously stated.  Vital Signs: BP 124/82  Pulse 80  Ht 6' (1.829 m)  Wt 212 lb (96.163 kg)  BMI 28.75 kg/m2  Physical Exam Patient is in NAD  HEENT:  Normocephalic, atraumatic. EOMI, PERRLA.  Neck: JVP is normal. No thyromegaly. No bruits.  Lungs: clear to auscultation. No rales no wheezes.  Heart: Regular rate and rhythm. Normal S1, S2. No S3.   No  significant murmurs. PMI not displaced.  Abdomen:  Supple, nontender. Normal bowel sounds. No masses. No hepatomegaly.  Extremities:   Good distal pulses throughout. No lower extremity edema.  Musculoskeletal :moving all extremities.  Neuro:   alert and oriented x3.  CN II-XII grossly intact.   Assessment and Plan:

## 2011-06-04 NOTE — Patient Instructions (Signed)
Your physician has requested that you have an echocardiogram. Echocardiography is a painless test that uses sound waves to create images of your heart. It provides your doctor with information about the size and shape of your heart and how well your heart's chambers and valves are working. This procedure takes approximately one hour. There are no restrictions for this procedure.  Your physician has requested that you have a stress echocardiogram. For further information please visit [email or web address]. Please follow instruction sheet as given.

## 2011-06-05 ENCOUNTER — Encounter: Payer: Self-pay | Admitting: Internal Medicine

## 2011-06-05 DIAGNOSIS — R0602 Shortness of breath: Secondary | ICD-10-CM | POA: Insufficient documentation

## 2011-06-05 NOTE — Assessment & Plan Note (Signed)
I am not sure what DOE and sweating represent.  With decline in exercise tolerance I would recomm an echo and stress echo to evaluate diastolic function, exercise tolerance and r/o ischemia. Continue activities as tolerated.

## 2011-06-11 ENCOUNTER — Ambulatory Visit (HOSPITAL_COMMUNITY): Payer: Private Health Insurance - Indemnity | Attending: Cardiology

## 2011-06-11 ENCOUNTER — Other Ambulatory Visit: Payer: Self-pay

## 2011-06-11 DIAGNOSIS — R0602 Shortness of breath: Secondary | ICD-10-CM | POA: Insufficient documentation

## 2011-06-11 DIAGNOSIS — E785 Hyperlipidemia, unspecified: Secondary | ICD-10-CM | POA: Insufficient documentation

## 2011-06-11 DIAGNOSIS — I079 Rheumatic tricuspid valve disease, unspecified: Secondary | ICD-10-CM | POA: Insufficient documentation

## 2011-06-11 DIAGNOSIS — R0609 Other forms of dyspnea: Secondary | ICD-10-CM | POA: Insufficient documentation

## 2011-06-11 DIAGNOSIS — R0989 Other specified symptoms and signs involving the circulatory and respiratory systems: Secondary | ICD-10-CM | POA: Insufficient documentation

## 2011-06-11 DIAGNOSIS — I059 Rheumatic mitral valve disease, unspecified: Secondary | ICD-10-CM | POA: Insufficient documentation

## 2011-07-05 ENCOUNTER — Ambulatory Visit (HOSPITAL_COMMUNITY): Payer: Medicare HMO

## 2011-07-05 ENCOUNTER — Encounter: Payer: Self-pay | Admitting: Internal Medicine

## 2011-07-05 ENCOUNTER — Ambulatory Visit (INDEPENDENT_AMBULATORY_CARE_PROVIDER_SITE_OTHER): Payer: Medicare HMO | Admitting: Internal Medicine

## 2011-07-05 ENCOUNTER — Ambulatory Visit: Payer: Medicare HMO | Admitting: Internal Medicine

## 2011-07-05 VITALS — BP 117/87

## 2011-07-05 DIAGNOSIS — R0602 Shortness of breath: Secondary | ICD-10-CM

## 2011-07-05 DIAGNOSIS — R0600 Dyspnea, unspecified: Secondary | ICD-10-CM

## 2011-07-05 DIAGNOSIS — R0609 Other forms of dyspnea: Secondary | ICD-10-CM

## 2011-07-05 NOTE — Progress Notes (Signed)
Exercise Treadmill Test  Pre-Exercise Testing Evaluation Rhythm: normal sinus  Rate: 76   PR:  .14 QRS:  .09  QT:  .36 QTc: .41           Test  Exercise Tolerance Test Ordering MD: Dorris Carnes, MD  Interpreting MD:  Dorris Carnes, MD  Unique Test No: 1  Treadmill:  2  Indication for ETT: chest pain - rule out ischemia  Contraindication to ETT: No   Stress Modality: exercise - treadmill  Cardiac Imaging Performed: non   Protocol: standard Bruce - maximal  Max BP:  167/79  Max MPHR (bpm):  185 85% MPR (bpm):  157  MPHR obtained (bpm):  184 % MPHR obtained:  99  Reached 85% MPHR (min:sec):  6:15 Total Exercise Time (min-sec):  8:00  Workload in METS:  10.10 Borg Scale: 17  Reason ETT Terminated:  dyspnea and target heart rate achieved    ST Segment Analysis At Rest: NSR>  No ST changes to suggest ischemia ith Exercise: no evidence of significant ST depression  Other Information Arrhythmia:  No Angina during ETT:  absent (0) Quality of ETT:  diagnostic  ETT Interpretation:  normal - no evidence of ischemia by ST analysis  Comments: Good exercise capacity.    Dorris Carnes

## 2011-07-12 NOTE — Progress Notes (Signed)
Patient ID: Edward Dougherty, male   DOB: [birth year], 36 y.o.   MRN: [id number]

## 2011-07-18 NOTE — Progress Notes (Deleted)
Patient ID: Edward Dougherty, male   DOB: 12-28-1975, 36 y.o.   MRN: [id number]

## 2011-10-01 ENCOUNTER — Telehealth: Payer: Self-pay | Admitting: *Deleted

## 2011-10-01 DIAGNOSIS — K5190 Ulcerative colitis, unspecified, without complications: Secondary | ICD-10-CM

## 2011-10-01 NOTE — Telephone Encounter (Signed)
Message copied by Oda Kilts on Fri Oct 01, 2011  9:51 AM ------      Message from: Oda Kilts      Created: Tue Mar 23, 2011 10:16 AM      Regarding: labs       CBC and LFT's in 6 months and 1 year

## 2011-10-04 NOTE — Telephone Encounter (Signed)
ok 

## 2011-10-04 NOTE — Telephone Encounter (Signed)
Dr Deatra Ina, This patient is past due on his labs. He is in [place] working will not be back till middle of July. He will be in to get them drawn when he gets back

## 2011-10-25 ENCOUNTER — Other Ambulatory Visit (INDEPENDENT_AMBULATORY_CARE_PROVIDER_SITE_OTHER): Payer: Private Health Insurance - Indemnity

## 2011-10-25 ENCOUNTER — Telehealth: Payer: Self-pay | Admitting: Gastroenterology

## 2011-10-25 DIAGNOSIS — K5190 Ulcerative colitis, unspecified, without complications: Secondary | ICD-10-CM

## 2011-10-25 LAB — CBC WITH DIFFERENTIAL/PLATELET
Basophils Absolute: 0 10*3/uL (ref 0.0–0.1)
Basophils Relative: 0.6 % (ref 0.0–3.0)
Eosinophils Absolute: 0.2 10*3/uL (ref 0.0–0.7)
Eosinophils Relative: 3.1 % (ref 0.0–5.0)
HCT: 44 % (ref 39.0–52.0)
Hemoglobin: 14.9 g/dL (ref 13.0–17.0)
Lymphocytes Relative: 27.8 % (ref 12.0–46.0)
Lymphs Abs: 2.1 10*3/uL (ref 0.7–4.0)
MCHC: 33.8 g/dL (ref 30.0–36.0)
MCV: 88.9 fl (ref 78.0–100.0)
Monocytes Absolute: 0.6 10*3/uL (ref 0.1–1.0)
Monocytes Relative: 7.4 % (ref 3.0–12.0)
Neutro Abs: 4.7 10*3/uL (ref 1.4–7.7)
Neutrophils Relative %: 61.1 % (ref 43.0–77.0)
Platelets: 270 10*3/uL (ref 150.0–400.0)
RBC: 4.96 Mil/uL (ref 4.22–5.81)
RDW: 13.1 % (ref 11.5–14.6)
WBC: 7.6 10*3/uL (ref 4.5–10.5)

## 2011-10-25 LAB — HEPATIC FUNCTION PANEL
ALT: 30 U/L (ref 0–53)
AST: 24 U/L (ref 0–37)
Albumin: 4.3 g/dL (ref 3.5–5.2)
Alkaline Phosphatase: 70 U/L (ref 39–117)
Bilirubin, Direct: 0.1 mg/dL (ref 0.0–0.3)
Total Bilirubin: 0.6 mg/dL (ref 0.3–1.2)
Total Protein: 7.4 g/dL (ref 6.0–8.3)

## 2011-10-25 MED ORDER — MESALAMINE 1.2 G PO TBEC: DELAYED_RELEASE_TABLET | ORAL | Status: DC

## 2011-10-25 NOTE — Progress Notes (Signed)
Quick Note:  Please inform the patient that lab work was normal and to continue current plan of action ______ 

## 2011-10-25 NOTE — Telephone Encounter (Signed)
Medication sent to pharmacy  

## 2012-07-11 ENCOUNTER — Other Ambulatory Visit (INDEPENDENT_AMBULATORY_CARE_PROVIDER_SITE_OTHER): Payer: Managed Care, Other (non HMO)

## 2012-07-11 ENCOUNTER — Encounter: Payer: Self-pay | Admitting: Gastroenterology

## 2012-07-11 ENCOUNTER — Ambulatory Visit (INDEPENDENT_AMBULATORY_CARE_PROVIDER_SITE_OTHER): Payer: Managed Care, Other (non HMO) | Admitting: Gastroenterology

## 2012-07-11 VITALS — BP 120/76 | HR 94 | Ht 71.0 in | Wt 219.0 lb

## 2012-07-11 DIAGNOSIS — K5190 Ulcerative colitis, unspecified, without complications: Secondary | ICD-10-CM

## 2012-07-11 DIAGNOSIS — K5150 Left sided colitis without complications: Secondary | ICD-10-CM

## 2012-07-11 DIAGNOSIS — K219 Gastro-esophageal reflux disease without esophagitis: Secondary | ICD-10-CM | POA: Insufficient documentation

## 2012-07-11 LAB — COMPREHENSIVE METABOLIC PANEL
ALT: 60 U/L — ABNORMAL HIGH (ref 0–53)
AST: 36 U/L (ref 0–37)
Albumin: 4.4 g/dL (ref 3.5–5.2)
Alkaline Phosphatase: 76 U/L (ref 39–117)
BUN: 9 mg/dL (ref 6–23)
CO2: 25 mEq/L (ref 19–32)
Calcium: 9.3 mg/dL (ref 8.4–10.5)
Chloride: 106 mEq/L (ref 96–112)
Creatinine, Ser: 0.9 mg/dL (ref 0.4–1.5)
GFR: 98.62 mL/min (ref >60.00)
Glucose, Bld: 92 mg/dL (ref 70–99)
Potassium: 3.7 mEq/L (ref 3.5–5.1)
Sodium: 139 mEq/L (ref 135–145)
Total Bilirubin: 0.6 mg/dL (ref 0.3–1.2)
Total Protein: 7.5 g/dL (ref 6.0–8.3)

## 2012-07-11 LAB — CBC WITH DIFFERENTIAL/PLATELET
Basophils Absolute: 0.1 10*3/uL (ref 0.0–0.1)
Basophils Relative: 0.8 % (ref 0.0–3.0)
Eosinophils Absolute: 0.2 10*3/uL (ref 0.0–0.7)
Eosinophils Relative: 2.8 % (ref 0.0–5.0)
HCT: 45.1 % (ref 39.0–52.0)
Hemoglobin: 15 g/dL (ref 13.0–17.0)
Lymphocytes Relative: 24.9 % (ref 12.0–46.0)
Lymphs Abs: 1.9 10*3/uL (ref 0.7–4.0)
MCHC: 33.3 g/dL (ref 30.0–36.0)
MCV: 89.6 fl (ref 78.0–100.0)
Monocytes Absolute: 0.7 10*3/uL (ref 0.1–1.0)
Monocytes Relative: 8.6 % (ref 3.0–12.0)
Neutro Abs: 4.8 10*3/uL (ref 1.4–7.7)
Neutrophils Relative %: 62.9 % (ref 43.0–77.0)
Platelets: 275 10*3/uL (ref 150.0–400.0)
RBC: 5.04 Mil/uL (ref 4.22–5.81)
RDW: 13.3 % (ref 11.5–14.6)
WBC: 7.7 10*3/uL (ref 4.5–10.5)

## 2012-07-11 MED ORDER — OMEPRAZOLE 20 MG PO CPDR: 20.0000 mg | DELAYED_RELEASE_CAPSULE | Freq: Every day | ORAL | Status: DC

## 2012-07-11 NOTE — Assessment & Plan Note (Signed)
Patient remains in clinical remission on Lialda.  Conditions #1 check CBC and comprehensive metabolic profile every 6 months #2 check serologies for hepatitis A. and B. and vaccinate accordingly

## 2012-07-11 NOTE — Progress Notes (Signed)
History of Present Illness: Pleasant 37 year old white male with left-sided colitis diagnosed in 2010 here for annual checkup. On lialda symptoms are well-controlled. He has occasional flares characterized by diarrhea. He attributes this to stress at work and travel. He does a moderate amount of international travel. There is no history of rectal bleeding or abdominal pain. He also complains of very frequent pyrosis which is well-controlled with over-the-counter omeprazole. Should he go a day without medication has severe pyrosis. He denies dysphagia.    Past Medical History  Diagnosis Date  . Ulcerative colitis   . GERD (gastroesophageal reflux disease)    Past Surgical History  Procedure Laterality Date  . Vasectomy     family history includes Diabetes in his father and mother and Heart disease in his maternal uncle and mother.  There is no history of Colon cancer. Current Outpatient Prescriptions  Medication Sig Dispense Refill  . Cyanocobalamin (VITAMIN B-12 CR PO) Take 1 tablet by mouth daily.      . mesalamine (LIALDA) 1.2 G EC tablet Four tablets by mouth in the morning  120 tablet  3  . Multiple Vitamin (MULTIVITAMIN) tablet Take 1 tablet by mouth daily.      Marland Kitchen omeprazole (PRILOSEC) 20 MG capsule Take 20 mg by mouth daily.         No current facility-administered medications for this visit.   Allergies as of 07/11/2012 - Review Complete 07/11/2012  Allergen Reaction Noted  . Penicillins      reports that he quit smoking about 14 years ago. He has never used smokeless tobacco. He reports that he does not drink alcohol or use illicit drugs.     Review of Systems: Pertinent positive and negative review of systems were noted in the above HPI section. All other review of systems were otherwise negative.  Vital signs were reviewed in today's medical record Physical Exam: General: Well developed , well nourished, no acute distress Skin: anicteric Head: Normocephalic and  atraumatic Eyes:  sclerae anicteric, EOMI Ears: Normal auditory acuity Mouth: No deformity or lesions Neck: Supple, no masses or thyromegaly Lungs: Clear throughout to auscultation Heart: Regular rate and rhythm; no murmurs, rubs or bruits Abdomen: Soft, non tender and non distended. No masses, hepatosplenomegaly or hernias noted. Normal Bowel sounds Rectal:deferred Musculoskeletal: Symmetrical with no gross deformities  Skin: No lesions on visible extremities Pulses:  Normal pulses noted Extremities: No clubbing, cyanosis, edema or deformities noted Neurological: Alert oriented x 4, grossly nonfocal Cervical Nodes:  No significant cervical adenopathy Inguinal Nodes: No significant inguinal adenopathy Psychological:  Alert and cooperative. Normal mood and affect

## 2012-07-11 NOTE — Patient Instructions (Addendum)
Go to the basement for labs today You will have labs drawn again in 6 months and 12 months  You have been scheduled for an endoscopy with propofol. Please follow written instructions given to you at your visit today. If you use inhalers (even only as needed), please bring them with you on the day of your procedure. We will renew your medications

## 2012-07-11 NOTE — Assessment & Plan Note (Signed)
The patient has PPI-dependent GERD.  Recommendations #1 upper endoscopy to rule out Barrett's esophagus

## 2012-07-12 ENCOUNTER — Encounter: Payer: Self-pay | Admitting: Gastroenterology

## 2012-07-12 LAB — HEPATITIS PANEL, ACUTE
HCV Ab: NEGATIVE
Hep A IgM: NEGATIVE
Hep B C IgM: NEGATIVE
Hepatitis B Surface Ag: NEGATIVE

## 2012-07-17 ENCOUNTER — Other Ambulatory Visit: Payer: Self-pay | Admitting: Gastroenterology

## 2012-07-17 DIAGNOSIS — K7460 Unspecified cirrhosis of liver: Secondary | ICD-10-CM

## 2012-07-20 ENCOUNTER — Encounter: Payer: Self-pay | Admitting: Gastroenterology

## 2012-07-20 ENCOUNTER — Ambulatory Visit (INDEPENDENT_AMBULATORY_CARE_PROVIDER_SITE_OTHER): Payer: Managed Care, Other (non HMO) | Admitting: Gastroenterology

## 2012-07-20 ENCOUNTER — Ambulatory Visit (AMBULATORY_SURGERY_CENTER): Payer: Managed Care, Other (non HMO) | Admitting: Gastroenterology

## 2012-07-20 VITALS — BP 104/67 | HR 76 | Temp 97.7°F | Resp 18 | Ht 71.0 in | Wt 219.0 lb

## 2012-07-20 DIAGNOSIS — K209 Esophagitis, unspecified without bleeding: Secondary | ICD-10-CM

## 2012-07-20 DIAGNOSIS — K7460 Unspecified cirrhosis of liver: Secondary | ICD-10-CM

## 2012-07-20 DIAGNOSIS — Z23 Encounter for immunization: Secondary | ICD-10-CM

## 2012-07-20 DIAGNOSIS — K2270 Barrett's esophagus without dysplasia: Secondary | ICD-10-CM

## 2012-07-20 DIAGNOSIS — K219 Gastro-esophageal reflux disease without esophagitis: Secondary | ICD-10-CM

## 2012-07-20 MED ORDER — SODIUM CHLORIDE 0.9 % IV SOLN: 500.0000 mL | INTRAVENOUS | Status: DC

## 2012-07-20 NOTE — Progress Notes (Signed)
Called to room to assist during endoscopic procedure.  Patient ID and intended procedure confirmed with present staff. Received instructions for my participation in the procedure from the performing physician.  

## 2012-07-20 NOTE — Progress Notes (Signed)
DR Deatra Ina DID SEE PT & OK PT FOR D/C IN RECOVERY

## 2012-07-20 NOTE — Progress Notes (Signed)
Patient did not experience any of the following events: a burn prior to discharge; a fall within the facility; wrong site/side/patient/procedure/implant event; or a hospital transfer or hospital admission upon discharge from the facility. (G8907) Patient did not have preoperative order for IV antibiotic SSI prophylaxis. (G8918)  

## 2012-07-20 NOTE — Op Note (Signed)
[facility]  [address] [employer] [address] [place], [id number]   ENDOSCOPY PROCEDURE REPORT  PATIENT: Edward Dougherty, Edward Dougherty  MR#: [id number] BIRTHDATE: 03/13/1976 , 36  yrs. old GENDER: Male ENDOSCOPIST: [name], MD REFERRED BY:  Herbert Pun, M.D. PROCEDURE DATE:  07/20/2012 PROCEDURE:  EGD w/ biopsy ASA CLASS:     Class II INDICATIONS:  Barrett's screening.   History of esophageal reflux. MEDICATIONS: MAC sedation, administered by CRNA and Propofol (Diprivan) 130 mg IV TOPICAL ANESTHETIC:  DESCRIPTION OF PROCEDURE: After the risks benefits and alternatives of the procedure were thoroughly explained, informed consent was obtained.  The LB [id number] endoscope was introduced through the mouth and advanced to the third portion of the duodenum. Without limitations.  The instrument was slowly withdrawn as the mucosa was fully examined.      The GE junction was very irregular and seem to extend about 2-3 cm proximally.  There are islands of gastric appearing epithelium. Multiple biopsies were taken.   The GE junction was very irregular and seem to extend about 2-3 cm proximally.  There are islands of gastric appearing epithelium.  Multiple biopsies were taken.   The remainder of the upper endoscopy exam was otherwise normal. Retroflexed views revealed no abnormalities.     The scope was then withdrawn from the patient and the procedure completed.  COMPLICATIONS: There were no complications. ENDOSCOPIC IMPRESSION: 1.  possible Barrett's esophagus  RECOMMENDATIONS: await biopsy findings REPEAT EXAM:  eSigned:  [name], MD 07/20/2012 3:37 PM   CC:

## 2012-07-20 NOTE — Progress Notes (Signed)
Stable to RR 

## 2012-07-20 NOTE — Patient Instructions (Addendum)
YOU HAD AN ENDOSCOPIC PROCEDURE TODAY AT [facility]: Refer to the procedure report that was given to you for any specific questions about what was found during the examination.  If the procedure report does not answer your questions, please call your gastroenterologist to clarify.  If you requested that your care partner not be given the details of your procedure findings, then the procedure report has been included in a sealed envelope for you to review at your convenience later.  YOU SHOULD EXPECT: Some feelings of bloating in the abdomen. Passage of more gas than usual.  Walking can help get rid of the air that was put into your GI tract during the procedure and reduce the bloating. If you had a lower endoscopy (such as a colonoscopy or flexible sigmoidoscopy) you may notice spotting of blood in your stool or on the toilet paper. If you underwent a bowel prep for your procedure, then you may not have a normal bowel movement for a few days.  DIET: Your first meal following the procedure should be a light meal and then it is ok to progress to your normal diet.  A half-sandwich or bowl of soup is an example of a good first meal.  Heavy or fried foods are harder to digest and may make you feel nauseous or bloated.  Likewise meals heavy in dairy and vegetables can cause extra gas to form and this can also increase the bloating.  Drink plenty of fluids but you should avoid alcoholic beverages for 24 hours.  ACTIVITY: Your care partner should take you home directly after the procedure.  You should plan to take it easy, moving slowly for the rest of the day.  You can resume normal activity the day after the procedure however you should NOT DRIVE or use heavy machinery for 24 hours (because of the sedation medicines used during the test).    SYMPTOMS TO REPORT IMMEDIATELY: A gastroenterologist can be reached at any hour.  During normal business hours, 8:30 AM to 5:00 PM Monday through Friday,  call [phone].  After hours and on weekends, please call the GI answering service at [phone] who will take a message and have the physician on call contact you.     Following upper endoscopy (EGD)  Vomiting of blood or coffee ground material  New chest pain or pain under the shoulder blades  Painful or persistently difficult swallowing  New shortness of breath  Fever of 100F or higher  Black, tarry-looking stools  FOLLOW UP: If any biopsies were taken you will be contacted by phone or by letter within the next 1-3 weeks.  Call your gastroenterologist if you have not heard about the biopsies in 3 weeks.  Our staff will call the home number listed on your records the next business day following your procedure to check on you and address any questions or concerns that you may have at that time regarding the information given to you following your procedure. This is a courtesy call and so if there is no answer at the home number and we have not heard from you through the emergency physician on call, we will assume that you have returned to your regular daily activities without incident.  SIGNATURES/CONFIDENTIALITY: You and/or your care partner have signed paperwork which will be entered into your electronic medical record.  These signatures attest to the fact that that the information above on your After Visit Summary has been reviewed and is understood.  Full responsibility of the confidentiality of this discharge information lies with you and/or your care-partner.

## 2012-07-21 ENCOUNTER — Telehealth: Payer: Self-pay | Admitting: *Deleted

## 2012-07-21 NOTE — Telephone Encounter (Signed)
  Follow up Call-  Call back number 07/20/2012  Post procedure Call Back phone  # [phone]  Permission to leave phone message Yes     Patient questions:  Do you have a fever, pain , or abdominal swelling? no Pain Score  0 *  Have you tolerated food without any problems? yes  Have you been able to return to your normal activities? yes  Do you have any questions about your discharge instructions: Diet   no Medications  no Follow up visit  no  Do you have questions or concerns about your Care? no  Actions: * If pain score is 4 or above: No action needed, pain <4.

## 2012-07-29 ENCOUNTER — Encounter: Payer: Self-pay | Admitting: Gastroenterology

## 2012-08-21 ENCOUNTER — Ambulatory Visit (INDEPENDENT_AMBULATORY_CARE_PROVIDER_SITE_OTHER): Payer: Managed Care, Other (non HMO) | Admitting: Gastroenterology

## 2012-08-21 DIAGNOSIS — K7460 Unspecified cirrhosis of liver: Secondary | ICD-10-CM

## 2012-08-21 DIAGNOSIS — Z23 Encounter for immunization: Secondary | ICD-10-CM

## 2012-12-04 ENCOUNTER — Telehealth: Payer: Self-pay | Admitting: Gastroenterology

## 2012-12-04 MED ORDER — MESALAMINE 1.2 G PO TBEC: DELAYED_RELEASE_TABLET | ORAL | Status: DC

## 2012-12-04 NOTE — Telephone Encounter (Signed)
Pt aware med sent

## 2012-12-05 ENCOUNTER — Telehealth: Payer: Self-pay | Admitting: *Deleted

## 2012-12-05 MED ORDER — MESALAMINE 1.2 G PO TBEC: DELAYED_RELEASE_TABLET | ORAL | Status: DC

## 2012-12-05 NOTE — Telephone Encounter (Signed)
Med sent to pharmacy.

## 2012-12-19 ENCOUNTER — Telehealth: Payer: Self-pay | Admitting: Gastroenterology

## 2012-12-19 NOTE — Telephone Encounter (Signed)
Pt called and wanted to confirm his Twinrix injection schedule. Pt is scheduled for another injection 02/21/13. Pt aware.

## 2013-03-02 ENCOUNTER — Ambulatory Visit (INDEPENDENT_AMBULATORY_CARE_PROVIDER_SITE_OTHER): Payer: Managed Care, Other (non HMO) | Admitting: Gastroenterology

## 2013-03-02 DIAGNOSIS — Z23 Encounter for immunization: Secondary | ICD-10-CM

## 2013-03-02 DIAGNOSIS — K7460 Unspecified cirrhosis of liver: Secondary | ICD-10-CM

## 2013-03-30 ENCOUNTER — Telehealth: Payer: Self-pay | Admitting: Gastroenterology

## 2013-03-30 MED ORDER — MESALAMINE 1.2 G PO TBEC: DELAYED_RELEASE_TABLET | ORAL | Status: DC

## 2013-03-30 NOTE — Telephone Encounter (Signed)
MEDICATION SENT TO PHARMACY  CALLED PT TO INFORM

## 2013-08-03 ENCOUNTER — Other Ambulatory Visit: Payer: Self-pay | Admitting: *Deleted

## 2013-08-03 MED ORDER — OMEPRAZOLE 20 MG PO CPDR: 20.0000 mg | DELAYED_RELEASE_CAPSULE | Freq: Every day | ORAL | Status: DC

## 2013-10-30 ENCOUNTER — Telehealth: Payer: Self-pay | Admitting: *Deleted

## 2013-10-30 MED ORDER — MESALAMINE 1.2 G PO TBEC: DELAYED_RELEASE_TABLET | ORAL | Status: DC

## 2013-10-30 NOTE — Telephone Encounter (Signed)
Med sent to pharmacy per fax request

## 2014-05-24 ENCOUNTER — Telehealth: Payer: Self-pay | Admitting: *Deleted

## 2014-05-24 DIAGNOSIS — K5190 Ulcerative colitis, unspecified, without complications: Secondary | ICD-10-CM

## 2014-05-24 MED ORDER — MESALAMINE 1.2 G PO TBEC: DELAYED_RELEASE_TABLET | ORAL | Status: DC

## 2014-05-24 NOTE — Telephone Encounter (Signed)
Patient is requesting Lialda refills  Patient needs CBC CMET and LFTS drawn  And follow up office appointment pt will call back to schedule    Will refill lialda this time with only one refill  Lab orders are in computer

## 2014-06-12 ENCOUNTER — Other Ambulatory Visit (INDEPENDENT_AMBULATORY_CARE_PROVIDER_SITE_OTHER): Payer: Managed Care, Other (non HMO)

## 2014-06-12 DIAGNOSIS — K5190 Ulcerative colitis, unspecified, without complications: Secondary | ICD-10-CM

## 2014-06-12 LAB — CBC WITH DIFFERENTIAL/PLATELET
Basophils Absolute: 0 10*3/uL (ref 0.0–0.1)
Basophils Relative: 0.4 % (ref 0.0–3.0)
Eosinophils Absolute: 0.2 10*3/uL (ref 0.0–0.7)
Eosinophils Relative: 2 % (ref 0.0–5.0)
HCT: 44.3 % (ref 39.0–52.0)
Hemoglobin: 15.1 g/dL (ref 13.0–17.0)
Lymphocytes Relative: 28.1 % (ref 12.0–46.0)
Lymphs Abs: 2.1 10*3/uL (ref 0.7–4.0)
MCHC: 34.2 g/dL (ref 30.0–36.0)
MCV: 88 fl (ref 78.0–100.0)
Monocytes Absolute: 0.7 10*3/uL (ref 0.1–1.0)
Monocytes Relative: 9.1 % (ref 3.0–12.0)
Neutro Abs: 4.5 10*3/uL (ref 1.4–7.7)
Neutrophils Relative %: 60.4 % (ref 43.0–77.0)
Platelets: 299 10*3/uL (ref 150.0–400.0)
RBC: 5.03 Mil/uL (ref 4.22–5.81)
RDW: 13.7 % (ref 11.5–15.5)
WBC: 7.5 10*3/uL (ref 4.0–10.5)

## 2014-06-12 LAB — HEPATIC FUNCTION PANEL
ALT: 61 U/L — ABNORMAL HIGH (ref 0–53)
AST: 38 U/L — ABNORMAL HIGH (ref 0–37)
Albumin: 4.6 g/dL (ref 3.5–5.2)
Alkaline Phosphatase: 73 U/L (ref 39–117)
Bilirubin, Direct: 0.1 mg/dL (ref 0.0–0.3)
Total Bilirubin: 0.8 mg/dL (ref 0.2–1.2)
Total Protein: 7.3 g/dL (ref 6.0–8.3)

## 2014-06-17 ENCOUNTER — Other Ambulatory Visit: Payer: Managed Care, Other (non HMO)

## 2014-06-17 ENCOUNTER — Other Ambulatory Visit: Payer: Self-pay

## 2014-06-17 DIAGNOSIS — R748 Abnormal levels of other serum enzymes: Secondary | ICD-10-CM

## 2014-06-18 LAB — HEPATITIS C ANTIBODY: HCV Ab: NEGATIVE

## 2014-06-18 LAB — HEPATITIS B SURFACE ANTIBODY,QUALITATIVE: Hep B S Ab: POSITIVE — AB

## 2014-06-18 LAB — HEPATITIS A ANTIBODY, TOTAL: Hep A Total Ab: REACTIVE — AB

## 2014-06-18 LAB — HEPATITIS B SURFACE ANTIGEN: Hepatitis B Surface Ag: NEGATIVE

## 2014-07-01 ENCOUNTER — Ambulatory Visit (HOSPITAL_COMMUNITY)
Admission: RE | Admit: 2014-07-01 | Discharge: 2014-07-01 | Disposition: A | Discharge status: Home / Self Care | Payer: Managed Care, Other (non HMO) | Source: Ambulatory Visit | Attending: Gastroenterology | Admitting: Gastroenterology

## 2014-07-01 DIAGNOSIS — R7989 Other specified abnormal findings of blood chemistry: Secondary | ICD-10-CM | POA: Diagnosis present

## 2014-07-01 DIAGNOSIS — K760 Fatty (change of) liver, not elsewhere classified: Secondary | ICD-10-CM | POA: Insufficient documentation

## 2014-07-01 DIAGNOSIS — R748 Abnormal levels of other serum enzymes: Secondary | ICD-10-CM

## 2014-07-25 ENCOUNTER — Ambulatory Visit (INDEPENDENT_AMBULATORY_CARE_PROVIDER_SITE_OTHER): Payer: Managed Care, Other (non HMO) | Admitting: Gastroenterology

## 2014-07-25 ENCOUNTER — Encounter: Payer: Self-pay | Admitting: Gastroenterology

## 2014-07-25 VITALS — BP 120/86 | HR 80 | Ht 70.75 in | Wt 209.4 lb

## 2014-07-25 DIAGNOSIS — K5150 Left sided colitis without complications: Secondary | ICD-10-CM

## 2014-07-25 DIAGNOSIS — K760 Fatty (change of) liver, not elsewhere classified: Secondary | ICD-10-CM | POA: Insufficient documentation

## 2014-07-25 MED ORDER — OMEPRAZOLE 20 MG PO CPDR: 20.0000 mg | DELAYED_RELEASE_CAPSULE | Freq: Every day | ORAL | Status: DC

## 2014-07-25 MED ORDER — MESALAMINE 1.2 G PO TBEC: DELAYED_RELEASE_TABLET | ORAL | Status: DC

## 2014-07-25 NOTE — Patient Instructions (Signed)
Follow up labs (LFT's) in 6 and 12 months Follow up office visit in 1 year

## 2014-07-25 NOTE — Assessment & Plan Note (Signed)
Patient remains in clinical remission on lialda.  Plan to continue with the same.

## 2014-07-25 NOTE — Addendum Note (Signed)
Addended by: Oda Kilts on: 07/25/2014 09:32 AM   Modules accepted: Orders

## 2014-07-25 NOTE — Progress Notes (Signed)
      History of Present Illness:  Edward Dougherty has returned for follow-up of left-sided colitis.  On lialda he remains in clinical remission.  Bowels are regular and he is without pain or bleeding.  Ultrasound demonstrated mild hepatic steatosis.    Review of Systems: Pertinent positive and negative review of systems were noted in the above HPI section. All other review of systems were otherwise negative.    Current Medications, Allergies, Past Medical History, Past Surgical History, Family History and Social History were reviewed in [facility] record  Vital signs were reviewed in today's medical record. Physical Exam: General: Well developed , well nourished, no acute distress Skin: anicteric Head: Normocephalic and atraumatic Eyes:  sclerae anicteric, EOMI Ears: Normal auditory acuity Mouth: No deformity or lesions Lungs: Clear throughout to auscultation Heart: Regular rate and rhythm; no murmurs, rubs or bruits Abdomen: Soft, non tender and non distended. No masses, hepatosplenomegaly or hernias noted. Normal Bowel sounds Rectal:deferred Musculoskeletal: Symmetrical with no gross deformities  Pulses:  Normal pulses noted Extremities: No clubbing, cyanosis, edema or deformities noted Neurological: Alert oriented x 4, grossly nonfocal Psychological:  Alert and cooperative. Normal mood and affect  See Assessment and Plan under Problem List

## 2014-07-25 NOTE — Assessment & Plan Note (Signed)
Hepatic steatosis accounts for the patient's LFT abnormalities.  Patient is exercising and weight loss was advised.

## 2014-11-20 ENCOUNTER — Telehealth: Payer: Self-pay | Admitting: Gastroenterology

## 2014-11-21 NOTE — Telephone Encounter (Signed)
Steatosis. Last hepatic function done in February this year. Minimally elavated. When would labs be repeated?

## 2014-11-22 ENCOUNTER — Other Ambulatory Visit: Payer: Self-pay

## 2014-11-22 DIAGNOSIS — K760 Fatty (change of) liver, not elsewhere classified: Secondary | ICD-10-CM

## 2014-11-22 NOTE — Telephone Encounter (Signed)
4/17

## 2015-02-04 ENCOUNTER — Telehealth: Payer: Self-pay | Admitting: *Deleted

## 2015-02-04 DIAGNOSIS — K5180 Other ulcerative colitis without complications: Secondary | ICD-10-CM

## 2015-02-13 NOTE — Telephone Encounter (Signed)
Dr Havery Moros, This patient needs lft's done and has an appointment with you in Jan. Is there any other labs you will need him to have before his appointment so I can order them along with the LFT's . Patient coming in this week for labs

## 2015-02-13 NOTE — Telephone Encounter (Signed)
Please order CBC and CMP. I will meet him in clinic and discuss if anything else needs to be ordered at that time. Thanks

## 2015-02-14 NOTE — Telephone Encounter (Signed)
Put in orders patient aware to come in for labs

## 2015-02-19 ENCOUNTER — Other Ambulatory Visit (INDEPENDENT_AMBULATORY_CARE_PROVIDER_SITE_OTHER): Payer: Managed Care, Other (non HMO)

## 2015-02-19 DIAGNOSIS — K5180 Other ulcerative colitis without complications: Secondary | ICD-10-CM

## 2015-02-19 LAB — COMPREHENSIVE METABOLIC PANEL
ALT: 16 U/L (ref 0–53)
AST: 19 U/L (ref 0–37)
Albumin: 4.5 g/dL (ref 3.5–5.2)
Alkaline Phosphatase: 70 U/L (ref 39–117)
BUN: 8 mg/dL (ref 6–23)
CO2: 30 mEq/L (ref 19–32)
Calcium: 9.7 mg/dL (ref 8.4–10.5)
Chloride: 103 mEq/L (ref 96–112)
Creatinine, Ser: 1.01 mg/dL (ref 0.40–1.50)
GFR: 87.32 mL/min (ref >60.00)
Glucose, Bld: 83 mg/dL (ref 70–99)
Potassium: 4 mEq/L (ref 3.5–5.1)
Sodium: 141 mEq/L (ref 135–145)
Total Bilirubin: 1 mg/dL (ref 0.2–1.2)
Total Protein: 7.2 g/dL (ref 6.0–8.3)

## 2015-02-19 LAB — CBC WITH DIFFERENTIAL/PLATELET
Basophils Absolute: 0.1 10*3/uL (ref 0.0–0.1)
Basophils Relative: 1.2 % (ref 0.0–3.0)
Eosinophils Absolute: 0.2 10*3/uL (ref 0.0–0.7)
Eosinophils Relative: 3.1 % (ref 0.0–5.0)
HCT: 43.9 % (ref 39.0–52.0)
Hemoglobin: 14.6 g/dL (ref 13.0–17.0)
Lymphocytes Relative: 28.1 % (ref 12.0–46.0)
Lymphs Abs: 1.7 10*3/uL (ref 0.7–4.0)
MCHC: 33.3 g/dL (ref 30.0–36.0)
MCV: 90.3 fl (ref 78.0–100.0)
Monocytes Absolute: 0.5 10*3/uL (ref 0.1–1.0)
Monocytes Relative: 7.4 % (ref 3.0–12.0)
Neutro Abs: 3.7 10*3/uL (ref 1.4–7.7)
Neutrophils Relative %: 60.2 % (ref 43.0–77.0)
Platelets: 257 10*3/uL (ref 150.0–400.0)
RBC: 4.86 Mil/uL (ref 4.22–5.81)
RDW: 14.3 % (ref 11.5–15.5)
WBC: 6.2 10*3/uL (ref 4.0–10.5)

## 2015-02-19 LAB — HEPATIC FUNCTION PANEL
ALT: 16 U/L (ref 0–53)
AST: 19 U/L (ref 0–37)
Albumin: 4.5 g/dL (ref 3.5–5.2)
Alkaline Phosphatase: 70 U/L (ref 39–117)
Bilirubin, Direct: 0.1 mg/dL (ref 0.0–0.3)
Total Bilirubin: 1 mg/dL (ref 0.2–1.2)
Total Protein: 7.2 g/dL (ref 6.0–8.3)

## 2015-02-21 ENCOUNTER — Encounter: Payer: Self-pay | Admitting: Gastroenterology

## 2015-04-23 ENCOUNTER — Encounter: Payer: Self-pay | Admitting: Gastroenterology

## 2015-04-23 ENCOUNTER — Ambulatory Visit (INDEPENDENT_AMBULATORY_CARE_PROVIDER_SITE_OTHER): Payer: Managed Care, Other (non HMO) | Admitting: Gastroenterology

## 2015-04-23 VITALS — BP 112/74 | HR 72 | Ht 70.75 in | Wt 177.0 lb

## 2015-04-23 DIAGNOSIS — K760 Fatty (change of) liver, not elsewhere classified: Secondary | ICD-10-CM

## 2015-04-23 DIAGNOSIS — K51919 Ulcerative colitis, unspecified with unspecified complications: Secondary | ICD-10-CM | POA: Diagnosis not present

## 2015-04-23 DIAGNOSIS — K219 Gastro-esophageal reflux disease without esophagitis: Secondary | ICD-10-CM | POA: Diagnosis not present

## 2015-04-23 NOTE — Patient Instructions (Signed)
Thank you for choosing [facility] to care for you.

## 2015-04-23 NOTE — Progress Notes (Signed)
HPI :  40 y/o male with history of left sided UC and fatty liver, here for follow up. New patient to me, formerly followed by Dr. Deatra Ina.   He was diagnosed with left sided UC around 2010 or so time frame. He presented with blood in the stools and bowel habit changes at time of diagnosis. He has been on Lialda 4.8gm since diagnosis. He reports he is tolerating it well. He is not aware of any side effects. He never been on 2 tabs per day, always 4 tabs per day. He is having variable bowel movements depending on what he eats, he normally has 1-2 BMs per day. No blood in the stools. He has rare urgency, not much at all, previously had it daily. He denies NSAID use. He reports he eats very healthy.   He takes prilosec 51m daily, for GERD. Main symptom of his GERD is pyrosis, been on this regimen for several years. It works well for his symptoms and has no symptoms while on it. He previously tried coming off it or skipped several doses he will have symptoms, despite what he eats. He has made some changes in diet / exericse over time, and he started weaning himself off it in September. He reports over time he did not have any issues with heartburn when doing this, but reported he was having some breathing difficulty / shortness of breath. By this he means that sometimes he feels the need to take a deep breath, sporadically. No chest pains. He was told to go back on omeprazole which did not seem to get his breathing any better, after about a month of being on it. Over the holidays he has remained on his prilosec is still on board and respiratory symptoms have come back. He denies any exertional symptoms and breathing seems to improve with exercise. He exercises routinely without exertional symptoms. He has had a CXR without abnormalities per his primary care, no report in Epic.  He otherwise has had fatty liver noted on prior imaging.   Last colonoscopy at time of diagnosis.  Past Medical History    Diagnosis Date  . Ulcerative colitis   . GERD (gastroesophageal reflux disease)   . Fatty liver      Past Surgical History  Procedure Laterality Date  . Vasectomy     Family History  Problem Relation Age of Onset  . Diabetes Mother   . Heart disease Mother   . Diabetes Father   . Colon cancer Neg Hx   . Heart disease Maternal Uncle    Social History  Substance Use Topics  . Smoking status: Former Smoker    Quit date: 04/19/1998  . Smokeless tobacco: Never Used  . Alcohol Use: No   Current Outpatient Prescriptions  Medication Sig Dispense Refill  . Cyanocobalamin (VITAMIN B-12 CR PO) Take 1 tablet by mouth daily.    . mesalamine (LIALDA) 1.2 G EC tablet Four tablets by mouth in the morning 120 tablet 11  . Multiple Vitamin (MULTIVITAMIN) tablet Take 1 tablet by mouth daily.    . Omega-3 Fatty Acids (FISH OIL PO) Take 1 tablet by mouth daily.    .Marland Kitchenomeprazole (PRILOSEC) 20 MG capsule Take 1 capsule (20 mg total) by mouth daily. 30 capsule 11   No current facility-administered medications for this visit.   Allergies  Allergen Reactions  . Penicillins      Review of Systems: All systems reviewed and negative except where noted in HPI.  Lab Results  Component Value Date   WBC 6.2 02/19/2015   HGB 14.6 02/19/2015   HCT 43.9 02/19/2015   MCV 90.3 02/19/2015   PLT 257.0 02/19/2015   Lab Results  Component Value Date   ALT 16 02/19/2015   ALT 16 02/19/2015   AST 19 02/19/2015   AST 19 02/19/2015   ALKPHOS 70 02/19/2015   ALKPHOS 70 02/19/2015   BILITOT 1.0 02/19/2015   BILITOT 1.0 02/19/2015   Lab Results  Component Value Date   CREATININE 1.01 02/19/2015   BUN 8 02/19/2015   NA 141 02/19/2015   K 4.0 02/19/2015   CL 103 02/19/2015   CO2 30 02/19/2015     Physical Exam: BP 112/74 mmHg  Pulse 72  Ht 5' 10.75" (1.797 m)  Wt 177 lb (80.287 kg)  BMI 24.86 kg/m2 Constitutional: Pleasant,well-developed, male in no acute distress. HEENT:  Normocephalic and atraumatic. Conjunctivae are normal. No scleral icterus. Neck supple.  Cardiovascular: Normal rate, regular rhythm.  Pulmonary/chest: Effort normal and breath sounds normal. No wheezing, rales or rhonchi. Abdominal: Soft, nondistended, nontender. Bowel sounds active throughout. There are no masses palpable. No hepatomegaly. Extremities: no edema Lymphadenopathy: No cervical adenopathy noted. Neurological: Alert and oriented to person place and time. Skin: Skin is warm and dry. No rashes noted. Psychiatric: Normal mood and affect. Behavior is normal.   ASSESSMENT AND PLAN: 40 y/o male with diagnosis of left sided UC in 2010 and has felt quite well on present dose of Lialda since his diagnosis, but maintained on 4.8gm per day. Recommend he decrease to 2.4gm per day and see how he does on this maintenance dosing. Recommend surveillance colonoscopy once he has had disease for 15 years per AGA guidelines and otherwise continue his regimen for now. He warrants labs yearly. Stable renal function on Lialda. He should follow up yearly for this issue.   Fatty liver - improved with exercise, last LFTs normal. Recommend he continue with lifestyle changes, check LFTs yearly, minimize alcohol use. Now immune to hepatitis A /B.  GERD - well controlled at this time. Following lifestyle changes he was able to wean himself off previously without classic reflux symptoms, but had some respiratory symptoms as above. However resuming prilosec has not improving his breathing symptoms at all, and I doubt related to GERD at this point. He will stop prilosec as before, slow taper, given GERD is not longer bothering him, recommend he follow up with his primary care if symptoms persist to consider PFTs. Unclear etiology, symptoms seem mild and improve with exercise. Defer to his primary care for workup of respiratory complains.   [place] Cellar, MD [facility] Pager [phone]

## 2015-05-20 IMAGING — US US ABDOMEN LIMITED
1 series · 14 of 25 positions shown · non-contrast
Comparison: None.

CLINICAL DATA: Elevated LFTs

EXAM:
US ABDOMEN LIMITED - RIGHT UPPER QUADRANT

[Series 1: us abdomen limited · 0.21mm/px · 14 of 57 slices shown]
[im 1/57]
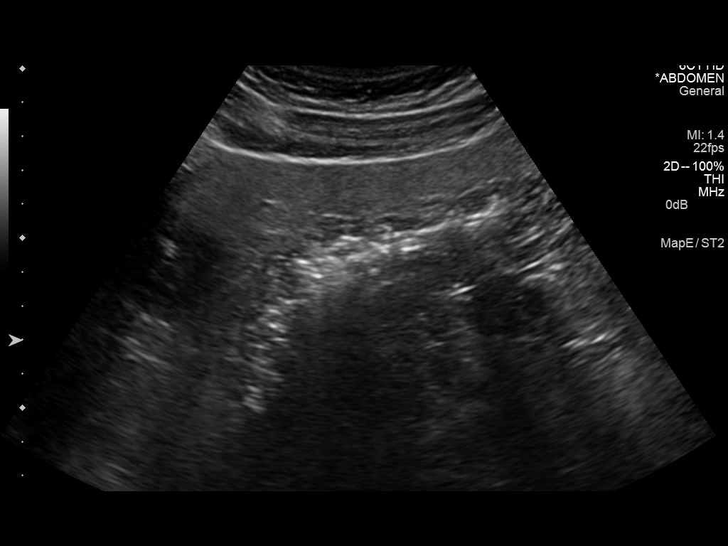
[im 5/57]
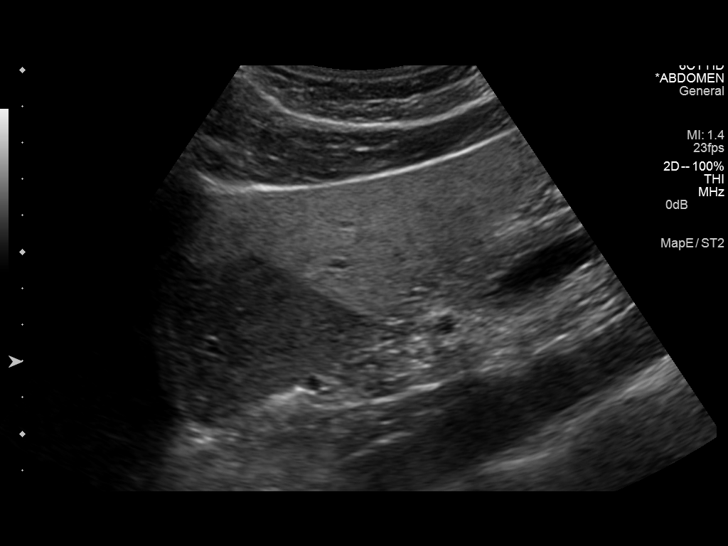
[im 10/57]
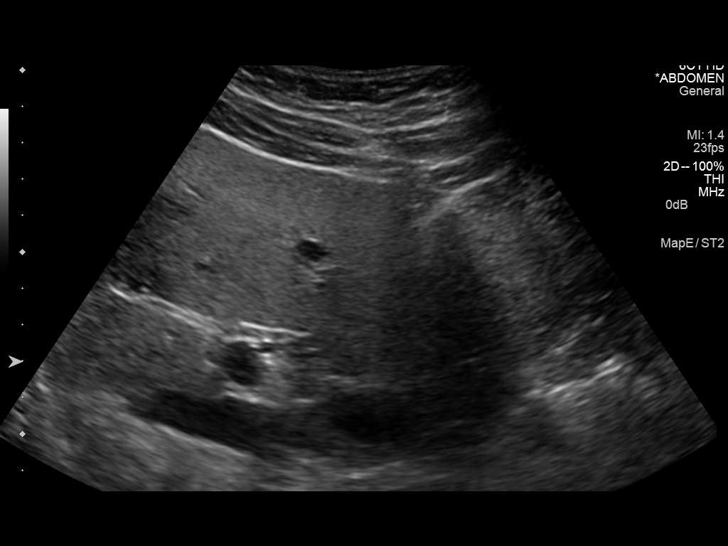
[im 15/57]
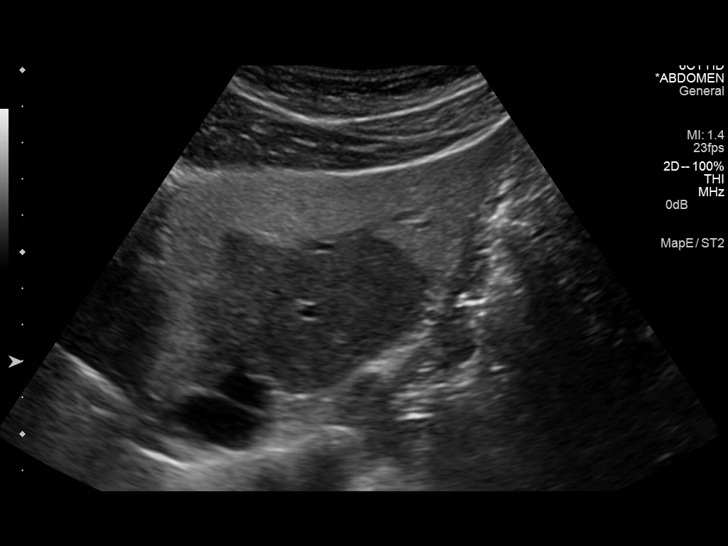
[im 19/57]
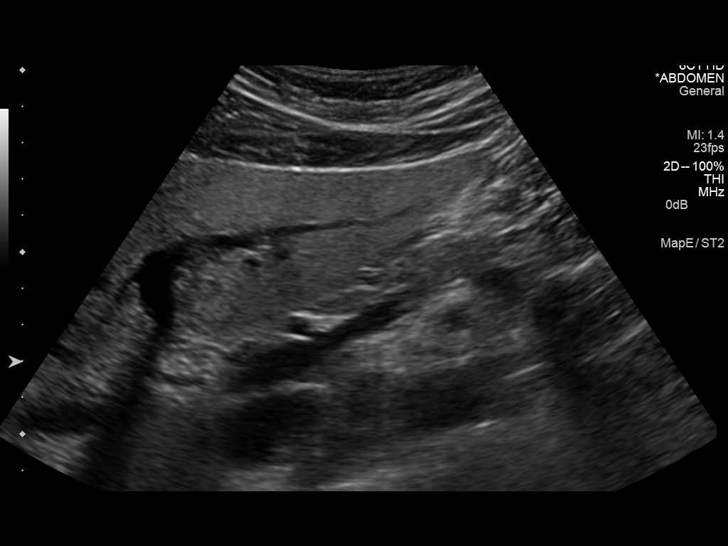
[im 22/57]
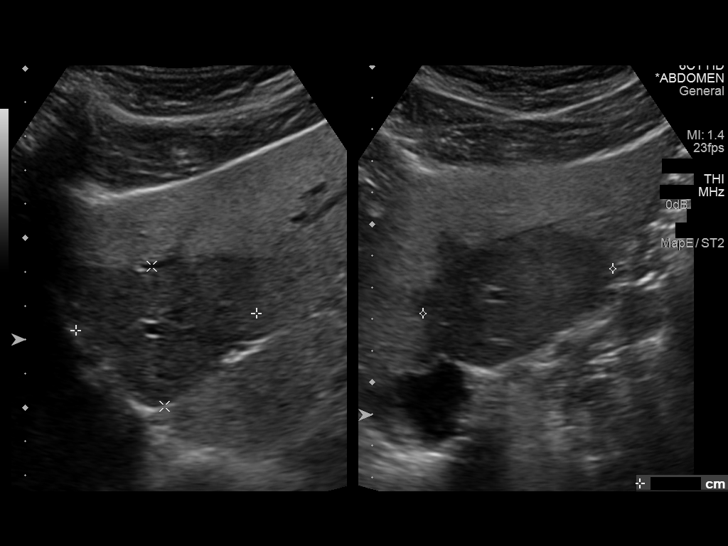
[im 26/57]
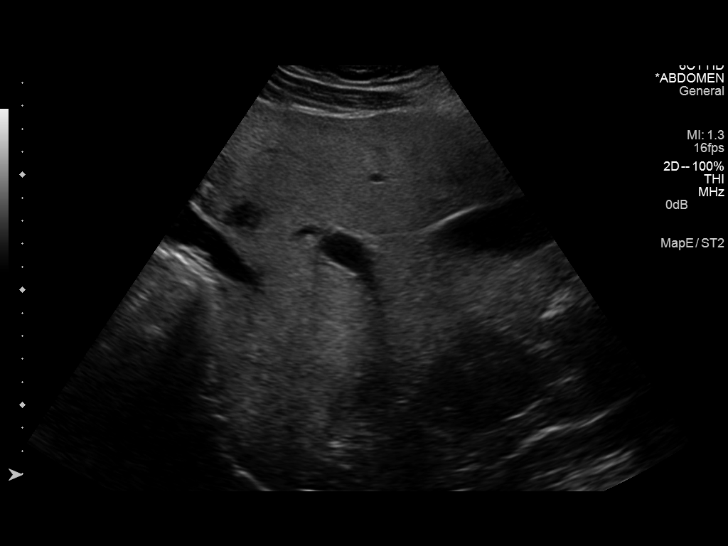
[im 31/57]
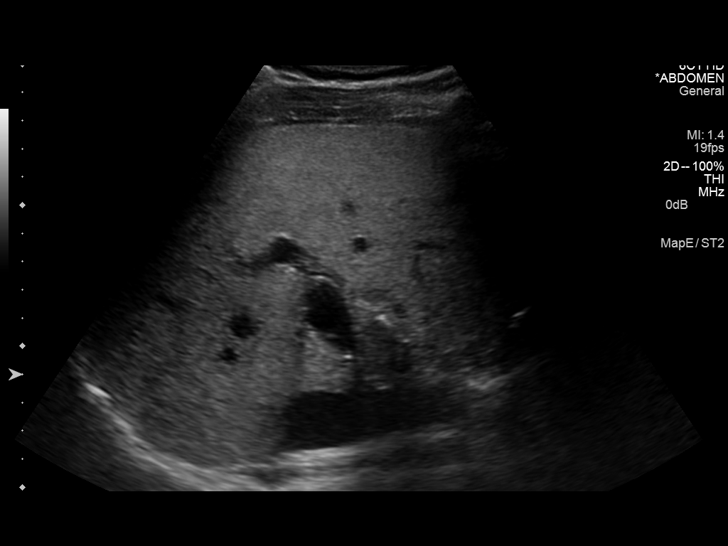
[im 36/57]
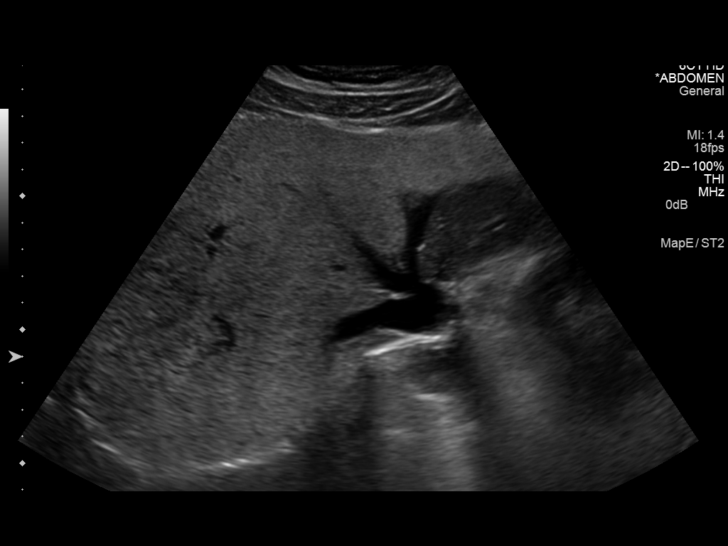
[im 38/57]
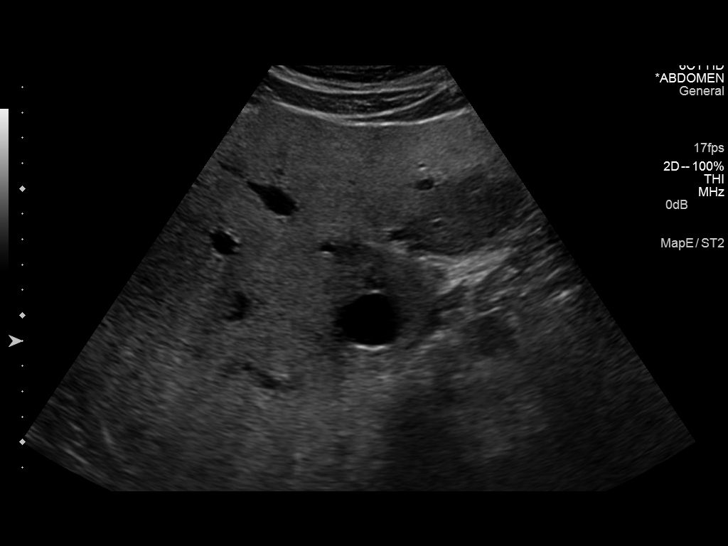
[im 43/57]
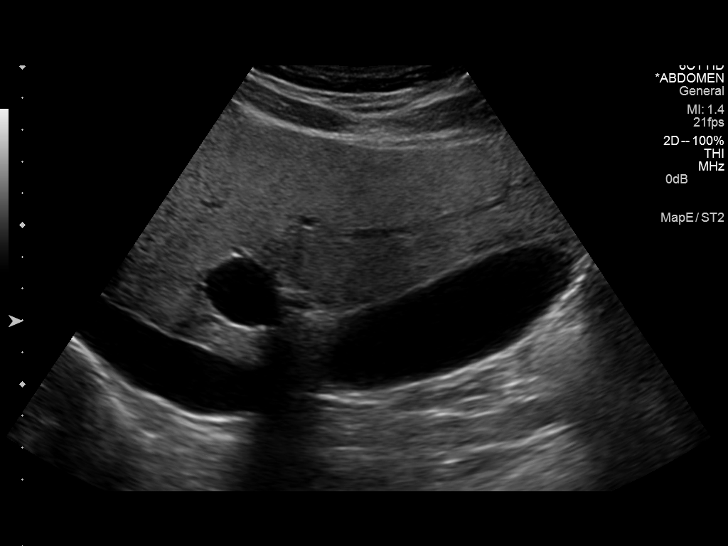
[im 47/57]
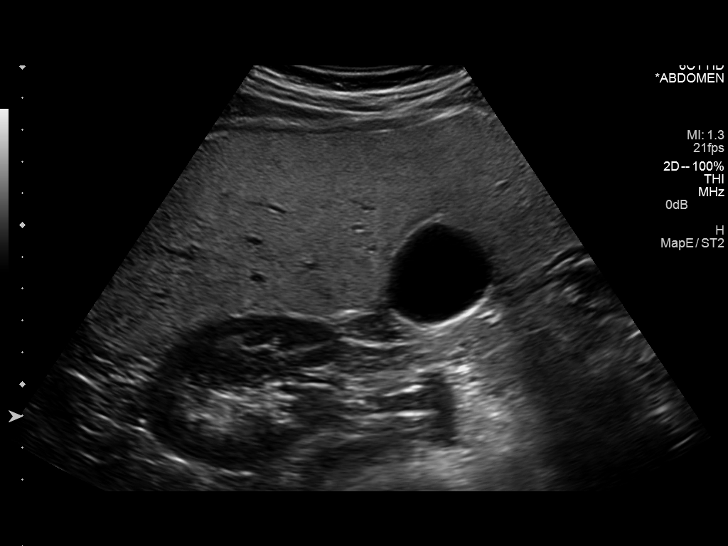
[im 52/57]
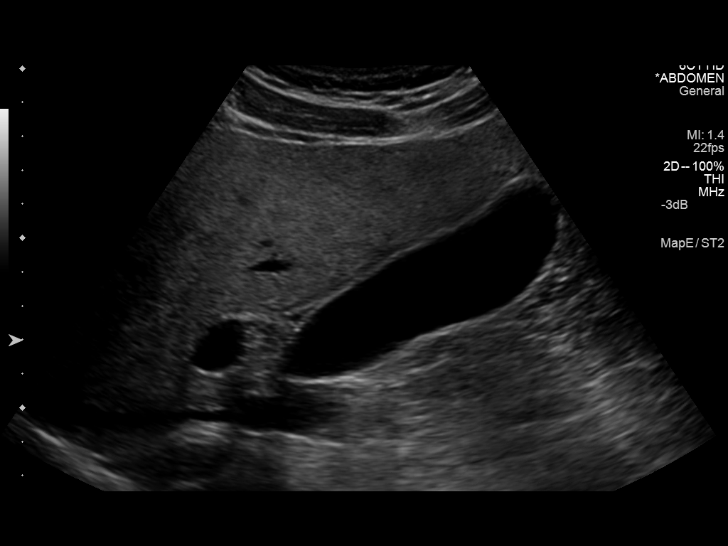
[im 57/57]
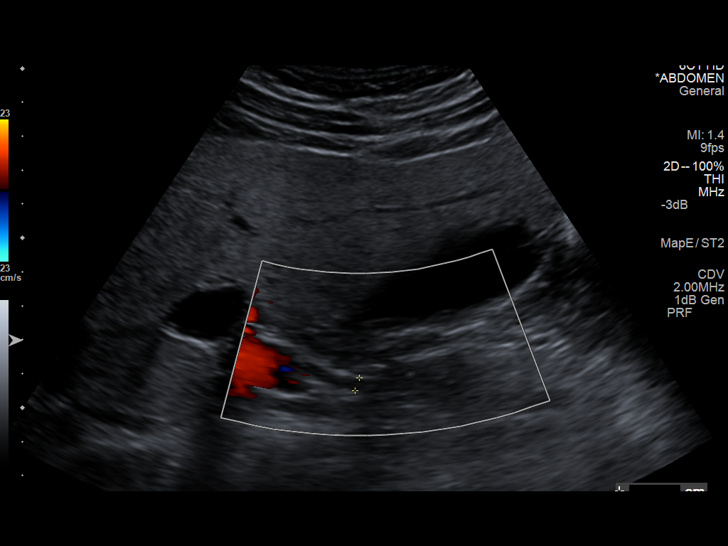

[14 of 25 positions shown; findings below may reference images not displayed]

FINDINGS: Gallbladder:

No gallstones or wall thickening visualized. No sonographic Murphy
sign noted.

Common bile duct:

Diameter: 4 mm

Liver:

Diffusely increased in echogenicity consistent with fatty
infiltration. There is a 5.4 x 4.2 x 6.2 cm area of decreased
echogenicity identified adjacent to the gallbladder fossa. This does
not distort the adjacent vasculature and is most consistent with an
area of focal fatty sparing.
IMPRESSION: Fatty infiltration of the liver within area of focal fatty sparing
adjacent to the gallbladder fossa.

## 2015-05-20 IMAGING — US US ABDOMEN LIMITED
1 series · 9 of 9 positions shown · non-contrast
Comparison: None.

CLINICAL DATA: Elevated LFTs

EXAM:
US ABDOMEN LIMITED - RIGHT UPPER QUADRANT

[Series 1: us abdomen limited · 0.24mm/px · 9 of 9 slices shown]
[im 1/9]
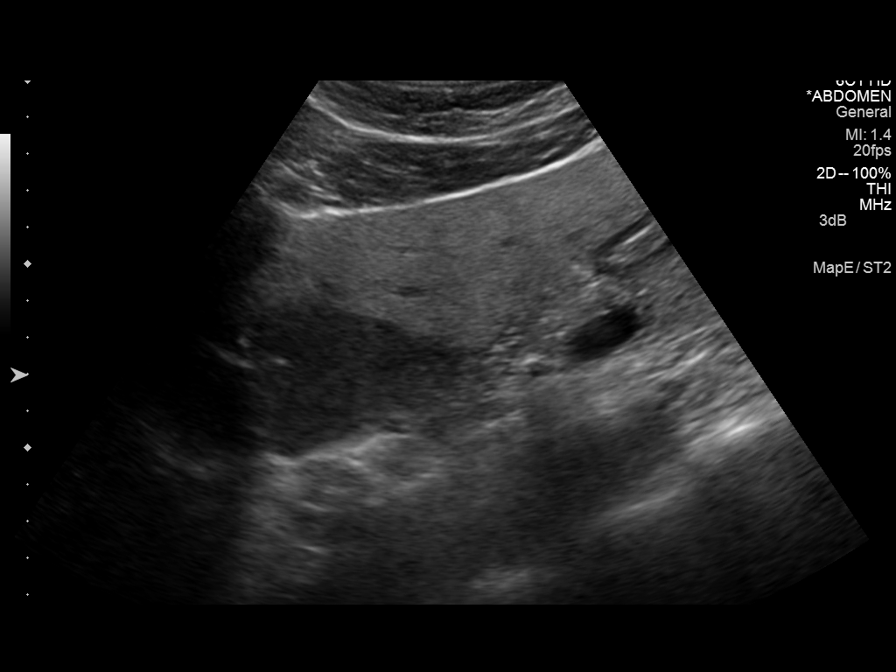
[im 2/9]
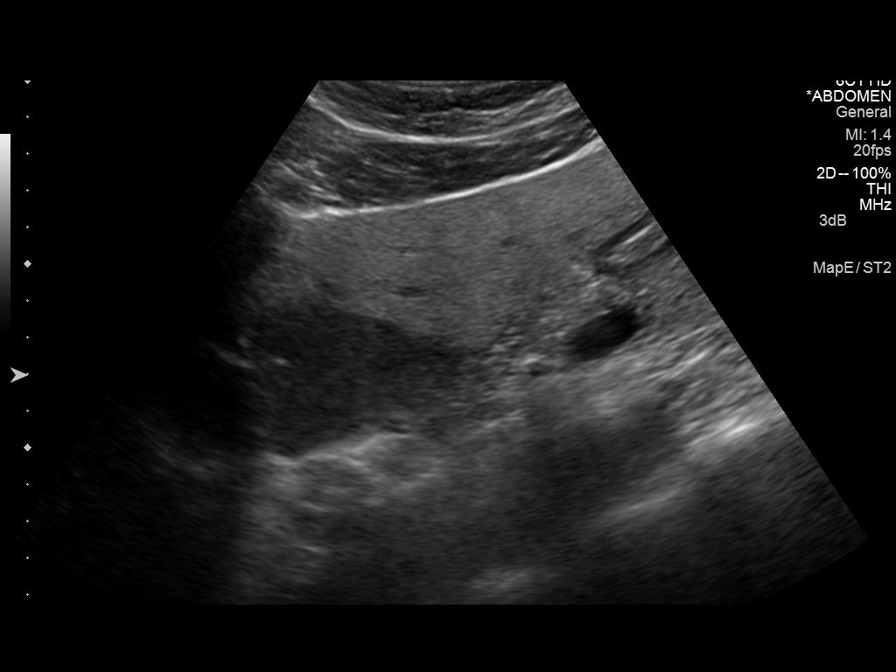
[im 3/9]
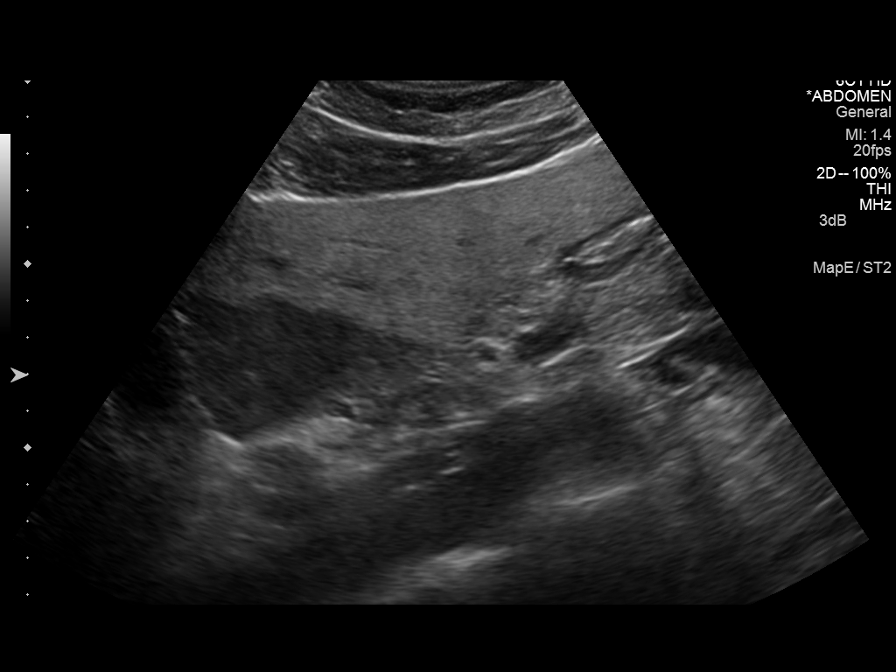
[im 4/9]
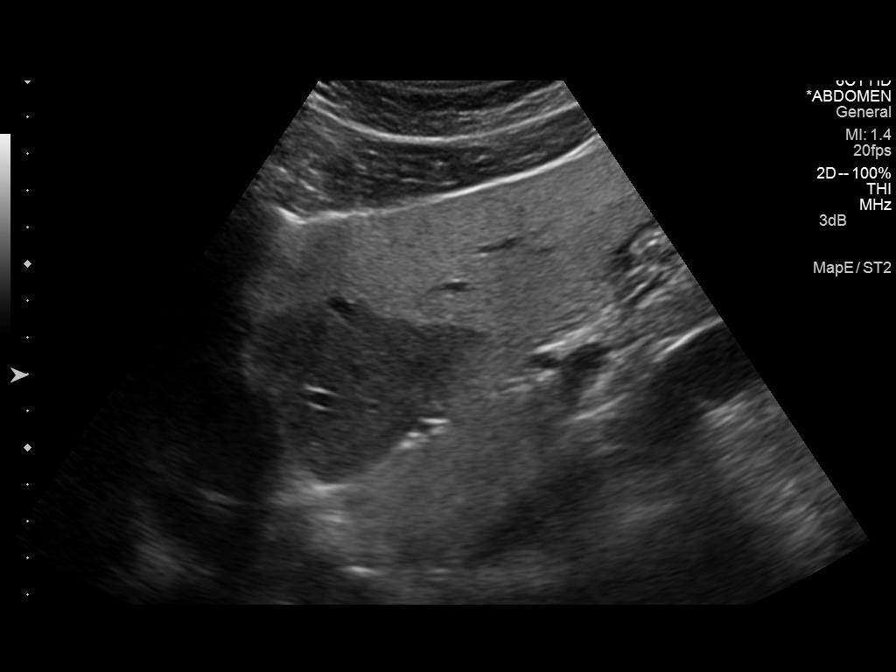
[im 5/9]
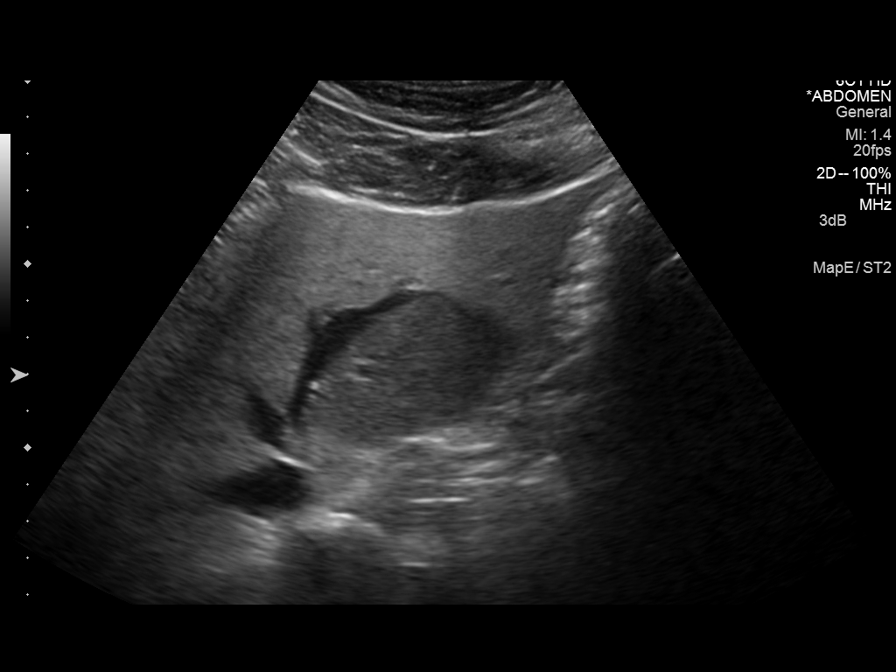
[im 6/9]
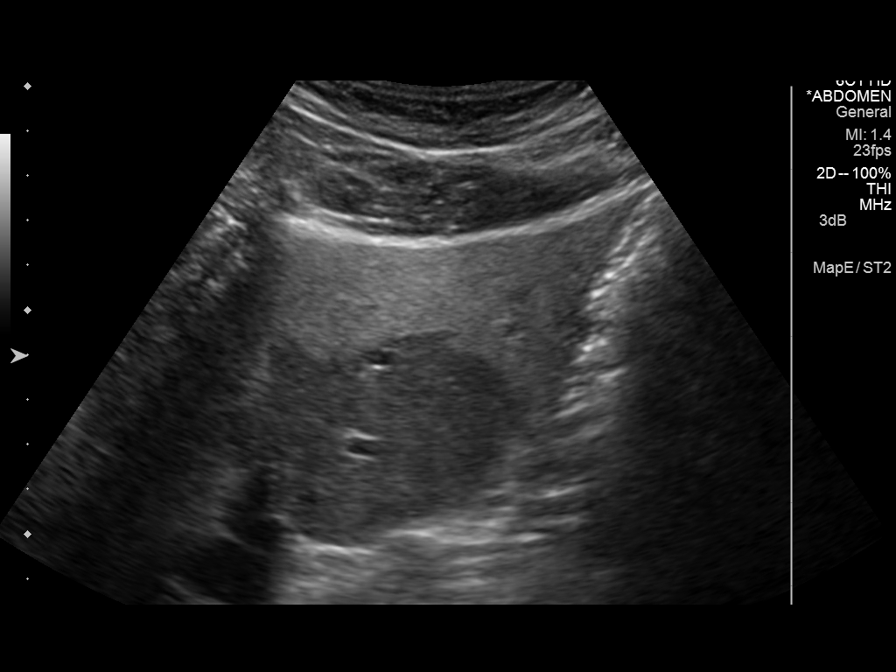
[im 7/9]
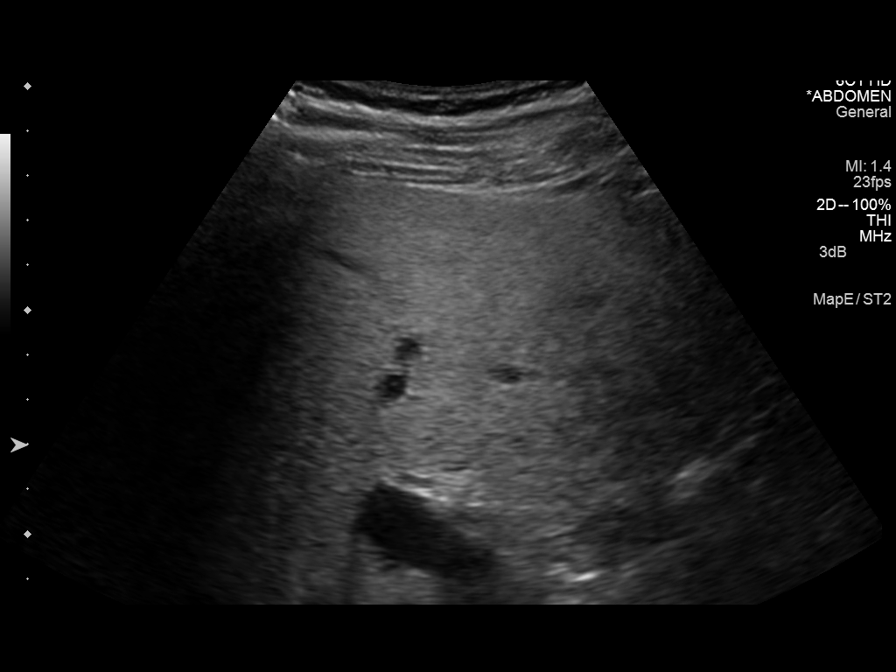
[im 8/9]
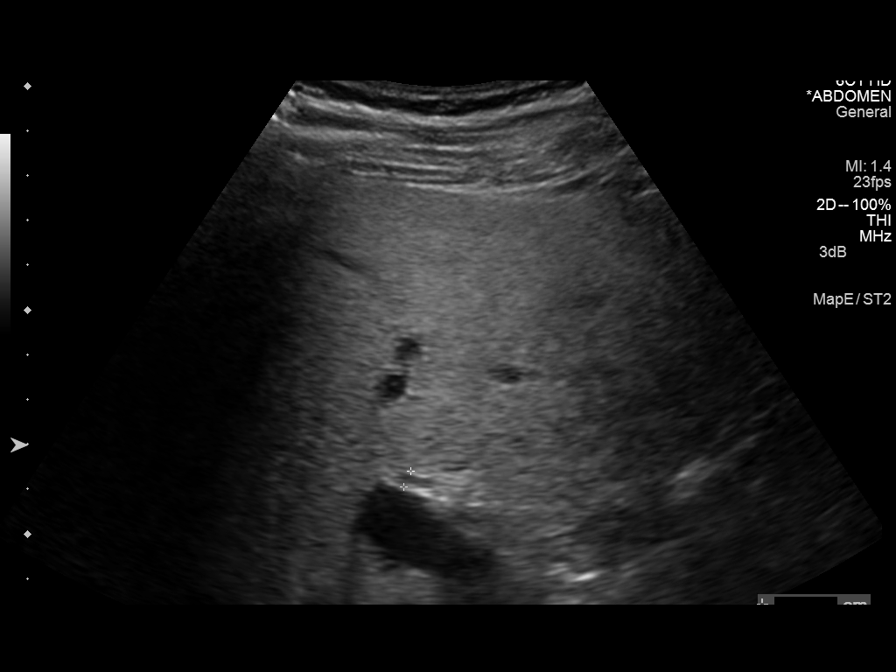
[im 9/9]
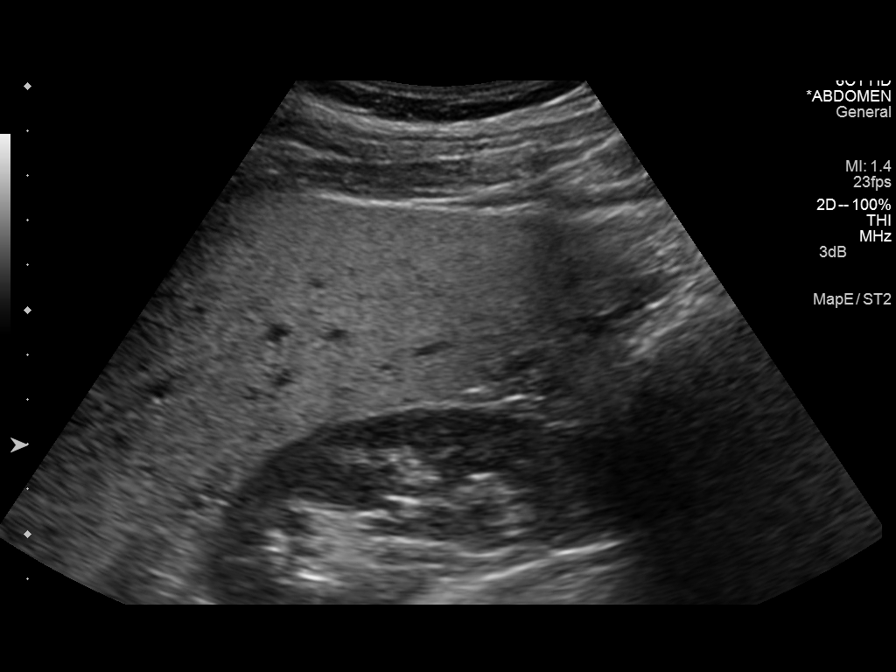

[9 of 9 positions shown; findings below may reference images not displayed]

FINDINGS: Gallbladder:

No gallstones or wall thickening visualized. No sonographic Murphy
sign noted.

Common bile duct:

Diameter: 4 mm

Liver:

Diffusely increased in echogenicity consistent with fatty
infiltration. There is a 5.4 x 4.2 x 6.2 cm area of decreased
echogenicity identified adjacent to the gallbladder fossa. This does
not distort the adjacent vasculature and is most consistent with an
area of focal fatty sparing.
IMPRESSION: Fatty infiltration of the liver within area of focal fatty sparing
adjacent to the gallbladder fossa.

## 2015-08-29 ENCOUNTER — Telehealth: Payer: Self-pay | Admitting: Gastroenterology

## 2015-08-29 DIAGNOSIS — K760 Fatty (change of) liver, not elsewhere classified: Secondary | ICD-10-CM

## 2015-08-29 DIAGNOSIS — K5150 Left sided colitis without complications: Secondary | ICD-10-CM

## 2015-08-29 MED ORDER — MESALAMINE 1.2 G PO TBEC: DELAYED_RELEASE_TABLET | ORAL | Status: DC

## 2015-08-29 NOTE — Telephone Encounter (Signed)
L/M that prescription was sent to pharmacy

## 2016-05-28 ENCOUNTER — Telehealth: Payer: Self-pay | Admitting: Gastroenterology

## 2016-05-28 ENCOUNTER — Other Ambulatory Visit: Payer: Self-pay

## 2016-05-28 DIAGNOSIS — K760 Fatty (change of) liver, not elsewhere classified: Secondary | ICD-10-CM

## 2016-05-28 NOTE — Telephone Encounter (Signed)
Looking at Dr. Doyne Keel last note, ordered LFT's. Patient advised to come to our lab and get them done prior to his appointment so results can be given to him at appointment.

## 2016-06-02 ENCOUNTER — Other Ambulatory Visit (INDEPENDENT_AMBULATORY_CARE_PROVIDER_SITE_OTHER): Payer: Managed Care, Other (non HMO)

## 2016-06-02 DIAGNOSIS — K760 Fatty (change of) liver, not elsewhere classified: Secondary | ICD-10-CM

## 2016-06-02 LAB — HEPATIC FUNCTION PANEL
ALT: 16 U/L (ref 0–53)
AST: 21 U/L (ref 0–37)
Albumin: 4.9 g/dL (ref 3.5–5.2)
Alkaline Phosphatase: 56 U/L (ref 39–117)
Bilirubin, Direct: 0.1 mg/dL (ref 0.0–0.3)
Total Bilirubin: 0.6 mg/dL (ref 0.2–1.2)
Total Protein: 7.4 g/dL (ref 6.0–8.3)

## 2016-06-03 ENCOUNTER — Telehealth: Payer: Self-pay

## 2016-06-03 NOTE — Telephone Encounter (Signed)
Left message for pt to return call.

## 2016-06-03 NOTE — Telephone Encounter (Signed)
-----   Message from Manus Gunning, MD sent at 06/03/2016  7:30 AM EST ----- Farrel Gobble can you please relay the following to the patient: - The patients LFTs are normal - He is due to see me in March in clinic. For routine blood work on a yearly basis he also needs to have CBC and renal function done which was not done on this most recent set of labs. Can you please have him go to the lab for this prior to his clinic appointment. Thinks

## 2016-06-03 NOTE — Telephone Encounter (Signed)
Patient returning Ashley's call

## 2016-06-04 ENCOUNTER — Other Ambulatory Visit: Payer: Self-pay

## 2016-06-04 DIAGNOSIS — K51919 Ulcerative colitis, unspecified with unspecified complications: Secondary | ICD-10-CM

## 2016-06-04 NOTE — Telephone Encounter (Signed)
Pt informed of results. He will come about 2w prior to the appt to have labs.

## 2016-06-14 ENCOUNTER — Other Ambulatory Visit (INDEPENDENT_AMBULATORY_CARE_PROVIDER_SITE_OTHER): Payer: Managed Care, Other (non HMO)

## 2016-06-14 DIAGNOSIS — K51919 Ulcerative colitis, unspecified with unspecified complications: Secondary | ICD-10-CM

## 2016-06-14 LAB — RENAL FUNCTION PANEL
Albumin: 4.6 g/dL (ref 3.5–5.2)
BUN: 8 mg/dL (ref 6–23)
CO2: 29 mEq/L (ref 19–32)
Calcium: 9.6 mg/dL (ref 8.4–10.5)
Chloride: 105 mEq/L (ref 96–112)
Creatinine, Ser: 0.98 mg/dL (ref 0.40–1.50)
GFR: 89.81 mL/min (ref >60.00)
Glucose, Bld: 96 mg/dL (ref 70–99)
Phosphorus: 2.9 mg/dL (ref 2.3–4.6)
Potassium: 4.6 mEq/L (ref 3.5–5.1)
Sodium: 141 mEq/L (ref 135–145)

## 2016-06-14 LAB — CBC WITH DIFFERENTIAL/PLATELET
Basophils Absolute: 0 10*3/uL (ref 0.0–0.1)
Basophils Relative: 0.6 % (ref 0.0–3.0)
Eosinophils Absolute: 0.2 10*3/uL (ref 0.0–0.7)
Eosinophils Relative: 3.6 % (ref 0.0–5.0)
HCT: 43.2 % (ref 39.0–52.0)
Hemoglobin: 14.7 g/dL (ref 13.0–17.0)
Lymphocytes Relative: 30 % (ref 12.0–46.0)
Lymphs Abs: 1.9 10*3/uL (ref 0.7–4.0)
MCHC: 34.1 g/dL (ref 30.0–36.0)
MCV: 89.8 fl (ref 78.0–100.0)
Monocytes Absolute: 0.5 10*3/uL (ref 0.1–1.0)
Monocytes Relative: 8.3 % (ref 3.0–12.0)
Neutro Abs: 3.6 10*3/uL (ref 1.4–7.7)
Neutrophils Relative %: 57.5 % (ref 43.0–77.0)
Platelets: 231 10*3/uL (ref 150.0–400.0)
RBC: 4.82 Mil/uL (ref 4.22–5.81)
RDW: 13.5 % (ref 11.5–15.5)
WBC: 6.2 10*3/uL (ref 4.0–10.5)

## 2016-06-15 ENCOUNTER — Encounter: Payer: Self-pay | Admitting: Gastroenterology

## 2016-06-15 NOTE — Progress Notes (Signed)
Letter mailed

## 2016-06-28 ENCOUNTER — Encounter: Payer: Self-pay | Admitting: Gastroenterology

## 2016-06-28 ENCOUNTER — Ambulatory Visit (INDEPENDENT_AMBULATORY_CARE_PROVIDER_SITE_OTHER): Payer: 59 | Admitting: Gastroenterology

## 2016-06-28 VITALS — BP 100/70 | HR 60 | Ht 70.75 in | Wt 181.2 lb

## 2016-06-28 DIAGNOSIS — K5150 Left sided colitis without complications: Secondary | ICD-10-CM

## 2016-06-28 DIAGNOSIS — K760 Fatty (change of) liver, not elsewhere classified: Secondary | ICD-10-CM | POA: Diagnosis not present

## 2016-06-28 DIAGNOSIS — K219 Gastro-esophageal reflux disease without esophagitis: Secondary | ICD-10-CM

## 2016-06-28 MED ORDER — NA SULFATE-K SULFATE-MG SULF 17.5-3.13-1.6 GM/177ML PO SOLN: ORAL | 0 refills | Status: DC

## 2016-06-28 NOTE — Progress Notes (Signed)
HPI :  41 y/o male here for follow up for left sided UC and fatty liver, GERD.   Colitis history - diagnosed with left sided UC in 2010. Initially on 4.8gm Lialda daily and for the past year has been on 2.4gm / day. No prior hospitalizations for colitis. Last colonoscopy in 2010 at time of diagnosis.   Patient is here for follow-up visit today. Since his last visit he is been doing pretty well. He is noticed no change since decreasing from Lialda 4.8 g per day to 2.4 g per day. He denies any rectal bleeding. Generally doing well. Stool form is normal. He has variable BMs, usually once or twice, sometimes increased. His only complaint is occasionaly has some mild increased urgency with his bowels.   Regarding his reflux he came off omeprazole and generally doing pretty well. He has only rare heartburn symptoms for which she uses Tums as needed.  He reports he lost weight and exercising routinely since his last visit. No weight gain in regards to his history of fatty liver disease. Recent LFTs normal..    Past Medical History:  Diagnosis Date  . Fatty liver   . GERD (gastroesophageal reflux disease)   . Ulcerative colitis      Past Surgical History:  Procedure Laterality Date  . VASECTOMY     Family History  Problem Relation Age of Onset  . Diabetes Mother   . Heart disease Mother   . Diabetes Father   . Heart disease Maternal Uncle   . Colon cancer Neg Hx    Social History  Substance Use Topics  . Smoking status: Former Smoker    Quit date: 04/19/1998  . Smokeless tobacco: Never Used  . Alcohol use No   Current Outpatient Prescriptions  Medication Sig Dispense Refill  . mesalamine (LIALDA) 1.2 g EC tablet Take 2.4 g by mouth daily with breakfast.    . Multiple Vitamin (MULTIVITAMIN) tablet Take 1 tablet by mouth daily.    . Probiotic Product (PROBIOTIC PO) Take 1 tablet by mouth daily.     No current facility-administered medications for this visit.    Allergies    Allergen Reactions  . Penicillins      Review of Systems: All systems reviewed and negative except where noted in HPI.   Lab Results  Component Value Date   WBC 6.2 06/14/2016   HGB 14.7 06/14/2016   HCT 43.2 06/14/2016   MCV 89.8 06/14/2016   PLT 231.0 06/14/2016    Lab Results  Component Value Date   CREATININE 0.98 06/14/2016   BUN 8 06/14/2016   NA 141 06/14/2016   K 4.6 06/14/2016   CL 105 06/14/2016   CO2 29 06/14/2016    Lab Results  Component Value Date   ALT 16 06/02/2016   AST 21 06/02/2016   ALKPHOS 56 06/02/2016   BILITOT 0.6 06/02/2016     Physical Exam: BP 100/70 (BP Location: Left Arm, Patient Position: Sitting, Cuff Size: Normal)   Pulse 60   Ht 5' 10.75" (1.797 m)   Wt 181 lb 4 oz (82.2 kg)   BMI 25.46 kg/m  Constitutional: Pleasant,well-developed, male in no acute distress. HEENT: Normocephalic and atraumatic. Conjunctivae are normal. No scleral icterus. Neck supple.  Cardiovascular: Normal rate, regular rhythm.  Pulmonary/chest: Effort normal and breath sounds normal. No wheezing, rales or rhonchi. Abdominal: Soft, nondistended, nontender. There are no masses palpable. No hepatomegaly. Extremities: no edema Lymphadenopathy: No cervical adenopathy noted. Neurological: Alert and  oriented to person place and time. Skin: Skin is warm and dry. No rashes noted. Psychiatric: Normal mood and affect. Behavior is normal.   ASSESSMENT AND PLAN: 41 year old male here for reassessment following issues:  Left-sided colitis - diagnosed in 2010 and on mesalamine monotherapy since that time. Generally doing pretty well although does have occasional mild urgency with stool variability. He has no anemia and I don't think he has significant inflammation at present, but is possible he has some mild inflammation perhaps in the rectum given his urgency symptoms. He has not had a colonoscopy since diagnosis in am recommending 1 given his symptoms to see if he  has achieved mucosal healing. I discussed risks and benefits of colonoscopy with him and he wished to proceed. I offered him a trial of Rowasa in the interim and he declined for now, wishes to wait colonoscopy result.  GERD - using Tums as needed, no routine symptoms, not bothering him currently, he can follow-up as needed for this issue  Fatty liver - has made dietary changes and exercising routinely, last LFTs normal. Repeat in 1 year  [place] Cellar, MD [facility] Pager [phone]

## 2016-06-28 NOTE — Patient Instructions (Signed)

## 2016-07-15 ENCOUNTER — Encounter: Payer: Self-pay | Admitting: Gastroenterology

## 2016-07-29 ENCOUNTER — Ambulatory Visit (AMBULATORY_SURGERY_CENTER): Payer: 59 | Admitting: Gastroenterology

## 2016-07-29 ENCOUNTER — Encounter: Payer: Self-pay | Admitting: Gastroenterology

## 2016-07-29 VITALS — BP 94/54 | HR 60 | Temp 98.0°F | Resp 10 | Ht 70.0 in | Wt 181.0 lb

## 2016-07-29 DIAGNOSIS — K5150 Left sided colitis without complications: Secondary | ICD-10-CM | POA: Diagnosis not present

## 2016-07-29 DIAGNOSIS — K529 Noninfective gastroenteritis and colitis, unspecified: Secondary | ICD-10-CM | POA: Diagnosis not present

## 2016-07-29 MED ORDER — SODIUM CHLORIDE 0.9 % IV SOLN: 500.0000 mL | INTRAVENOUS | Status: DC

## 2016-07-29 NOTE — Patient Instructions (Signed)
Impression/Recommendations:  Hemorrhoid handout given to patient.  YOU HAD AN ENDOSCOPIC PROCEDURE TODAY AT [facility]:   Refer to the procedure report that was given to you for any specific questions about what was found during the examination.  If the procedure report does not answer your questions, please call your gastroenterologist to clarify.  If you requested that your care partner not be given the details of your procedure findings, then the procedure report has been included in a sealed envelope for you to review at your convenience later.  YOU SHOULD EXPECT: Some feelings of bloating in the abdomen. Passage of more gas than usual.  Walking can help get rid of the air that was put into your GI tract during the procedure and reduce the bloating. If you had a lower endoscopy (such as a colonoscopy or flexible sigmoidoscopy) you may notice spotting of blood in your stool or on the toilet paper. If you underwent a bowel prep for your procedure, you may not have a normal bowel movement for a few days.  Please Note:  You might notice some irritation and congestion in your nose or some drainage.  This is from the oxygen used during your procedure.  There is no need for concern and it should clear up in a day or so.  SYMPTOMS TO REPORT IMMEDIATELY:   Following lower endoscopy (colonoscopy or flexible sigmoidoscopy):  Excessive amounts of blood in the stool  Significant tenderness or worsening of abdominal pains  Swelling of the abdomen that is new, acute  Fever of 100F or higher For urgent or emergent issues, a gastroenterologist can be reached at any hour by calling [phone].   DIET:  We do recommend a small meal at first, but then you may proceed to your regular diet.  Drink plenty of fluids but you should avoid alcoholic beverages for 24 hours.  ACTIVITY:  You should plan to take it easy for the rest of today and you should NOT DRIVE or use heavy machinery until  tomorrow (because of the sedation medicines used during the test).    FOLLOW UP: Our staff will call the number listed on your records the next business day following your procedure to check on you and address any questions or concerns that you may have regarding the information given to you following your procedure. If we do not reach you, we will leave a message.  However, if you are feeling well and you are not experiencing any problems, there is no need to return our call.  We will assume that you have returned to your regular daily activities without incident.  If any biopsies were taken you will be contacted by phone or by letter within the next 1-3 weeks.  Please call us at [phone] if you have not heard about the biopsies in 3 weeks.    SIGNATURES/CONFIDENTIALITY: You and/or your care partner have signed paperwork which will be entered into your electronic medical record.  These signatures attest to the fact that that the information above on your After Visit Summary has been reviewed and is understood.  Full responsibility of the confidentiality of this discharge information lies with you and/or your care-partner.

## 2016-07-29 NOTE — Progress Notes (Signed)
Report to PACU, RN, vss, BBS= Clear.  

## 2016-07-29 NOTE — Op Note (Signed)
[place] Patient Name: Edward Dougherty Procedure Date: 07/29/2016 3:20 PM MRN: [id number] Endoscopist: Remo Lipps P. Armbruster MD, MD Age: 41 Referring MD:  Date of Birth: 03-14-1976 Gender: Male Account #: [id number] Procedure:                Colonoscopy Indications:              Assess therapeutic response to therapy of                            left-sided chronic ulcerative colitis (last exam at                            time of diagnosis in 2010), on mesalamine Medicines:                Monitored Anesthesia Care Procedure:                Pre-Anesthesia Assessment:                           - Prior to the procedure, a History and Physical                            was performed, and patient medications and                            allergies were reviewed. The patient's tolerance of                            previous anesthesia was also reviewed. The risks                            and benefits of the procedure and the sedation                            options and risks were discussed with the patient.                            All questions were answered, and informed consent                            was obtained. Prior Anticoagulants: The patient has                            taken no previous anticoagulant or antiplatelet                            agents. ASA Grade Assessment: II - A patient with                            mild systemic disease. After reviewing the risks                            and benefits, the patient was deemed in  satisfactory condition to undergo the procedure.                           After obtaining informed consent, the colonoscope                            was passed under direct vision. Throughout the                            procedure, the patient's blood pressure, pulse, and                            oxygen saturations were monitored continuously. The                            Colonoscope was  introduced through the anus and                            advanced to the the terminal ileum, with                            identification of the appendiceal orifice and IC                            valve. The colonoscopy was performed without                            difficulty. The patient tolerated the procedure                            well. The quality of the bowel preparation was                            good. The terminal ileum, ileocecal valve,                            appendiceal orifice, and rectum were photographed. Scope In: 3:26:30 PM Scope Out: 3:40:10 PM Scope Withdrawal Time: 0 hours 10 minutes 26 seconds  Total Procedure Duration: 0 hours 13 minutes 40 seconds  Findings:                 The perianal and digital rectal examinations were                            normal.                           The terminal ileum appeared normal.                           Internal hemorrhoids were found during                            retroflexion. The hemorrhoids were small.  The exam was otherwise without abnormality. No                            active inflammation was appreciated in any part of                            the colon.                           Biopsies were taken with a cold forceps for                            histology from the right, transverse, and left                            colon. Complications:            No immediate complications. Estimated blood loss:                            Minimal. Estimated Blood Loss:     Estimated blood loss was minimal. Impression:               - The examined portion of the ileum was normal.                           - Internal hemorrhoids.                           - The examination was otherwise normal, no active                            inflammation.                           - Biopsies were taken with a cold forceps for                            histology.                            Overall, patient appears to be in remission on                            mesalamine. Will await pathology results. Recommendation:           - Patient has a contact number available for                            emergencies. The signs and symptoms of potential                            delayed complications were discussed with the                            patient. Return to normal activities tomorrow.  Written discharge instructions were provided to the                            patient.                           - Resume previous diet.                           - Continue present medications.                           - Await pathology results. Remo Lipps P. Armbruster MD, MD 07/29/2016 3:44:15 PM This report has been signed electronically.

## 2016-07-29 NOTE — Progress Notes (Signed)
Called to room to assist during endoscopic procedure.  Patient ID and intended procedure confirmed with present staff. Received instructions for my participation in the procedure from the performing physician.  

## 2016-07-30 ENCOUNTER — Telehealth: Payer: Self-pay

## 2016-07-30 NOTE — Telephone Encounter (Signed)
  Follow up Call-  Call back number 07/29/2016  Post procedure Call Back phone  # [phone]  Permission to leave phone message Yes  Some recent data might be hidden     Patient questions:  Do you have a fever, pain , or abdominal swelling? No. Pain Score  0 *  Have you tolerated food without any problems? Yes.    Have you been able to return to your normal activities? Yes.    Do you have any questions about your discharge instructions: Diet   No. Medications  No. Follow up visit  No.  Do you have questions or concerns about your Care? No.  Actions: * If pain score is 4 or above: No action needed, pain <4.

## 2016-08-04 ENCOUNTER — Encounter: Payer: Self-pay | Admitting: Gastroenterology

## 2016-08-11 ENCOUNTER — Encounter: Payer: Self-pay | Admitting: Family Medicine

## 2016-09-14 ENCOUNTER — Other Ambulatory Visit: Payer: Self-pay | Admitting: Gastroenterology

## 2016-09-14 DIAGNOSIS — K760 Fatty (change of) liver, not elsewhere classified: Secondary | ICD-10-CM

## 2016-09-14 DIAGNOSIS — K5150 Left sided colitis without complications: Secondary | ICD-10-CM

## 2016-09-16 NOTE — Telephone Encounter (Signed)
Yes we can refill it. Dose is 2 tabs per day. Please give 3 month supply with 3 refills. thanks

## 2017-09-05 ENCOUNTER — Other Ambulatory Visit: Payer: Self-pay | Admitting: Gastroenterology

## 2017-09-05 DIAGNOSIS — K5150 Left sided colitis without complications: Secondary | ICD-10-CM

## 2017-09-05 DIAGNOSIS — K760 Fatty (change of) liver, not elsewhere classified: Secondary | ICD-10-CM

## 2017-09-06 ENCOUNTER — Other Ambulatory Visit: Payer: Self-pay

## 2017-09-06 ENCOUNTER — Telehealth: Payer: Self-pay | Admitting: Gastroenterology

## 2017-09-06 DIAGNOSIS — K760 Fatty (change of) liver, not elsewhere classified: Secondary | ICD-10-CM

## 2017-09-06 DIAGNOSIS — K51919 Ulcerative colitis, unspecified with unspecified complications: Secondary | ICD-10-CM

## 2017-09-06 DIAGNOSIS — K5150 Left sided colitis without complications: Secondary | ICD-10-CM

## 2017-09-06 MED ORDER — LIALDA 1.2 G PO TBEC: DELAYED_RELEASE_TABLET | ORAL | 1 refills | Status: DC

## 2017-09-06 NOTE — Progress Notes (Signed)
Pt has appt in July.  Orders entered. Pt notified

## 2017-09-06 NOTE — Progress Notes (Signed)
Refill Lialda to get pt to July appt with Dr. Havery Moros.

## 2017-09-06 NOTE — Telephone Encounter (Signed)
Thanks Jan, Yes CBC and CMET would be good to do prior to visit. Thanks

## 2017-09-06 NOTE — Telephone Encounter (Signed)
Orders entered. Pt notified.  Will plan to go to lab the week before appt

## 2017-09-06 NOTE — Telephone Encounter (Signed)
Refill of Lialda sent to hold pt to July appt.  Would you like him to have labs prior to appointment?

## 2017-10-25 ENCOUNTER — Other Ambulatory Visit (INDEPENDENT_AMBULATORY_CARE_PROVIDER_SITE_OTHER): Payer: 59

## 2017-10-25 DIAGNOSIS — K51919 Ulcerative colitis, unspecified with unspecified complications: Secondary | ICD-10-CM | POA: Diagnosis not present

## 2017-10-25 DIAGNOSIS — K760 Fatty (change of) liver, not elsewhere classified: Secondary | ICD-10-CM

## 2017-10-25 LAB — CBC WITH DIFFERENTIAL/PLATELET
Basophils Absolute: 0.1 10*3/uL (ref 0.0–0.1)
Basophils Relative: 1.1 % (ref 0.0–3.0)
Eosinophils Absolute: 0.5 10*3/uL (ref 0.0–0.7)
Eosinophils Relative: 5.7 % — ABNORMAL HIGH (ref 0.0–5.0)
HCT: 45 % (ref 39.0–52.0)
Hemoglobin: 15.4 g/dL (ref 13.0–17.0)
Lymphocytes Relative: 19.1 % (ref 12.0–46.0)
Lymphs Abs: 1.6 10*3/uL (ref 0.7–4.0)
MCHC: 34.2 g/dL (ref 30.0–36.0)
MCV: 89.2 fl (ref 78.0–100.0)
Monocytes Absolute: 0.7 10*3/uL (ref 0.1–1.0)
Monocytes Relative: 8.1 % (ref 3.0–12.0)
Neutro Abs: 5.5 10*3/uL (ref 1.4–7.7)
Neutrophils Relative %: 66 % (ref 43.0–77.0)
Platelets: 272 10*3/uL (ref 150.0–400.0)
RBC: 5.04 Mil/uL (ref 4.22–5.81)
RDW: 13 % (ref 11.5–15.5)
WBC: 8.3 10*3/uL (ref 4.0–10.5)

## 2017-10-25 LAB — COMPREHENSIVE METABOLIC PANEL
ALT: 15 U/L (ref 0–53)
AST: 18 U/L (ref 0–37)
Albumin: 4.6 g/dL (ref 3.5–5.2)
Alkaline Phosphatase: 59 U/L (ref 39–117)
BUN: 13 mg/dL (ref 6–23)
CO2: 29 mEq/L (ref 19–32)
Calcium: 9.4 mg/dL (ref 8.4–10.5)
Chloride: 104 mEq/L (ref 96–112)
Creatinine, Ser: 0.88 mg/dL (ref 0.40–1.50)
GFR: 101 mL/min (ref >60.00)
Glucose, Bld: 109 mg/dL — ABNORMAL HIGH (ref 70–99)
Potassium: 4.2 mEq/L (ref 3.5–5.1)
Sodium: 138 mEq/L (ref 135–145)
Total Bilirubin: 0.6 mg/dL (ref 0.2–1.2)
Total Protein: 7.3 g/dL (ref 6.0–8.3)

## 2017-11-02 ENCOUNTER — Encounter: Payer: Self-pay | Admitting: Gastroenterology

## 2017-11-02 ENCOUNTER — Ambulatory Visit: Payer: 59 | Admitting: Gastroenterology

## 2017-11-02 VITALS — BP 104/70 | HR 88 | Ht 70.75 in | Wt 189.5 lb

## 2017-11-02 DIAGNOSIS — K219 Gastro-esophageal reflux disease without esophagitis: Secondary | ICD-10-CM | POA: Diagnosis not present

## 2017-11-02 DIAGNOSIS — K760 Fatty (change of) liver, not elsewhere classified: Secondary | ICD-10-CM | POA: Diagnosis not present

## 2017-11-02 DIAGNOSIS — K5150 Left sided colitis without complications: Secondary | ICD-10-CM

## 2017-11-02 NOTE — Progress Notes (Signed)
HPI :  42 y/o male with a history of left sided UC, here for a follow up visit.  Colitis history - diagnosed with left sided UC in 2010. Initially on 4.8gm Lialda daily and for the past year has been on 2.4gm / day. No prior hospitalizations for colitis. He has been maintained on Lialda monotherapy with overall good control.   Since his last visit he had a colonoscopy in April 2018. The colon was normal endoscopically and pathology showed normal colon, thus in deep remission at that time. Overall he is been doing pretty well for the most part as long as he maintains a healthy diet. He states he thought he had 2 minor flares of symptoms when he was eating foods that were not in his normal diet. He endorses some mild urgency with some mucus during these times which lasted just a few days. He never had any overt diarrhea, no pains, no bleeding. Generally he is been doing really well. He has regular bowel habits at baseline without blood. He takes to Lialda per day and tolerates it well. He has been taking probiotic at night and he tries to use a tumor except where as well with his meals. He has been trying to lose a bit of weight more recently. He does have a history of reflux, he states since he is lost a few pounds his reflux is not bothering him too much. Overall doing well without any significant complaints today. He does have a history of fatty liver, working to maintain stable weight and eating healthy balanced diet. LFTs normal  Colonoscopy 07/29/2016 - normal colon, hemorrhoids - path shows normal colon      Past Medical History:  Diagnosis Date  . Fatty liver   . GERD (gastroesophageal reflux disease)   . Ulcerative colitis      Past Surgical History:  Procedure Laterality Date  . COLONOSCOPY    . VASECTOMY     Family History  Problem Relation Age of Onset  . Diabetes Mother   . Heart disease Mother   . Diabetes Father   . Heart disease Maternal Uncle   . Colon cancer Neg Hx    . Colon polyps Neg Hx   . Esophageal cancer Neg Hx   . Rectal cancer Neg Hx   . Stomach cancer Neg Hx    Social History   Tobacco Use  . Smoking status: Former Smoker    Last attempt to quit: 04/19/1998    Years since quitting: 19.5  . Smokeless tobacco: Never Used  Substance Use Topics  . Alcohol use: No  . Drug use: No   Current Outpatient Medications  Medication Sig Dispense Refill  . LIALDA 1.2 g EC tablet TAKE FOUR TABLETS BY MOUTH EVERY MORNING 120 tablet 1  . Multiple Vitamin (MULTIVITAMIN) tablet Take 1 tablet by mouth daily.    . Probiotic Product (PROBIOTIC PO) Take 1 tablet by mouth daily.     Current Facility-Administered Medications  Medication Dose Route Frequency Provider Last Rate Last Dose  . 0.9 %  sodium chloride infusion  500 mL Intravenous Continuous Armbruster, Carlota Raspberry, MD       Allergies  Allergen Reactions  . Penicillins      Review of Systems: All systems reviewed and negative except where noted in HPI.   Lab Results  Component Value Date   WBC 8.3 10/25/2017   HGB 15.4 10/25/2017   HCT 45.0 10/25/2017   MCV 89.2 10/25/2017  PLT 272.0 10/25/2017    Lab Results  Component Value Date   CREATININE 0.88 10/25/2017   BUN 13 10/25/2017   NA 138 10/25/2017   K 4.2 10/25/2017   CL 104 10/25/2017   CO2 29 10/25/2017    Lab Results  Component Value Date   ALT 15 10/25/2017   AST 18 10/25/2017   ALKPHOS 59 10/25/2017   BILITOT 0.6 10/25/2017     Physical Exam: BP 104/70 (BP Location: Left Arm, Patient Position: Sitting, Cuff Size: Normal)   Pulse 88   Ht 5' 10.75" (1.797 m)   Wt 189 lb 8 oz (86 kg)   BMI 26.62 kg/m  Constitutional: Pleasant,well-developed, male in no acute distress. HEENT: Normocephalic and atraumatic. Conjunctivae are normal. No scleral icterus. Neck supple.  Cardiovascular: Normal rate, regular rhythm.  Pulmonary/chest: Effort normal and breath sounds normal. No wheezing, rales or rhonchi. Abdominal: Soft,  nondistended, nontender. There are no masses palpable. No hepatomegaly. Extremities: no edema Lymphadenopathy: No cervical adenopathy noted. Neurological: Alert and oriented to person place and time. Skin: Skin is warm and dry. No rashes noted. Psychiatric: Normal mood and affect. Behavior is normal.   ASSESSMENT AND PLAN: 42 year old male here for reassessment of following issues:  Left sided UC - doing really well on mesalamine monotherapy, colonoscopy last year showed deep remission. He's had some slight bowel change in the setting of being on vacation and eating foods that are out of his norm, I'm not sure if this is just an intolerance to change in diet or very mild flare. Generally he is done really well with labs are normal. He will continue Lialda 2.4 g daily at this time. He wants to continue with tumeric supplementation as well. He should follow up with me yearly for labs and reassessment, sooner if needed.  GERD - well controlled with diet / weight loss, does not warrant medical therapy currently  Fatty liver - LFTs normal, continue to maintain weight, check LFTs yearly.  Kiln Cellar, MD [facility]

## 2017-11-02 NOTE — Patient Instructions (Signed)
If you are age 42 or older, your body mass index should be between 23-30. Your Body mass index is 26.62 kg/m. If this is out of the aforementioned range listed, please consider follow up with your Primary Care Provider.  If you are age 29 or younger, your body mass index should be between 19-25. Your Body mass index is 26.62 kg/m. If this is out of the aformentioned range listed, please consider follow up with your Primary Care Provider.    Thank you for entrusting me with your care and for choosing [facility], Dr. [place] Cellar

## 2017-12-16 ENCOUNTER — Telehealth: Payer: Self-pay | Admitting: Gastroenterology

## 2017-12-16 DIAGNOSIS — K760 Fatty (change of) liver, not elsewhere classified: Secondary | ICD-10-CM

## 2017-12-16 DIAGNOSIS — K5150 Left sided colitis without complications: Secondary | ICD-10-CM

## 2017-12-16 MED ORDER — LIALDA 1.2 G PO TBEC: 2.4000 g | DELAYED_RELEASE_TABLET | Freq: Every day | ORAL | 1 refills | Status: DC

## 2017-12-16 NOTE — Telephone Encounter (Signed)
Refill sent to [facility]

## 2018-01-27 ENCOUNTER — Other Ambulatory Visit: Payer: Self-pay | Admitting: Gastroenterology

## 2018-01-27 DIAGNOSIS — K5150 Left sided colitis without complications: Secondary | ICD-10-CM

## 2018-01-27 DIAGNOSIS — K760 Fatty (change of) liver, not elsewhere classified: Secondary | ICD-10-CM

## 2018-02-28 ENCOUNTER — Telehealth: Payer: Self-pay | Admitting: Gastroenterology

## 2018-02-28 NOTE — Telephone Encounter (Signed)
Noted  

## 2018-02-28 NOTE — Telephone Encounter (Signed)
Pt called to inform that effective Apr 19, 2018 his insurance [employer] will no longer cover lialda, pt was told that it may be approved with PA. [employer] will cover Balsalazide, sulfasalazine, apriso, and pentasa without PA.

## 2018-03-29 NOTE — Telephone Encounter (Signed)
Pt wants to know if any of the medications in the message below will act as well as Lialda and if so, which one Dr. Havery Moros would recommend? pls call him.

## 2018-03-31 MED ORDER — MESALAMINE ER 0.375 G PO CP24: 1.5000 g | ORAL_CAPSULE | ORAL | 3 refills | Status: DC

## 2018-03-31 NOTE — Addendum Note (Signed)
Addended by: Roetta Sessions on: 03/31/2018 08:55 AM   Modules accepted: Orders

## 2018-03-31 NOTE — Telephone Encounter (Signed)
Script for Apriso 1.5 g qd sent to pharmacy. Called and spoke to pt. He expressed understanding and appreciation.

## 2018-03-31 NOTE — Telephone Encounter (Signed)
Yes, if Lialda is not covered let's switch to Apriso 1.5 g once daily in the morning. Thanks

## 2018-05-02 ENCOUNTER — Telehealth: Payer: Self-pay | Admitting: Gastroenterology

## 2018-05-02 NOTE — Telephone Encounter (Signed)
Chandler from [place]. Call wanting to speak with someone about the new prescription they rec for the pt.

## 2018-05-03 NOTE — Telephone Encounter (Signed)
Called and spoke to pharmacist, April. They are wanting to clarify that Dr. Havery Moros intended to decrease his dose of mesalamine when we switched him from Lialda to [place]. He was on Lialda 2.4g but insurance would not cover that.  When we switched him to Apriso we lowered it to 1.5g (.375 g x 4 capsules).  Dr. Havery Moros, was this your intention? Thank you.

## 2018-05-03 NOTE — Telephone Encounter (Signed)
Called and notified April the pharmacist. She expressed understanding.

## 2018-05-03 NOTE — Telephone Encounter (Signed)
Yes that is the correct dose of maintenance Apriso for colitis. Thanks

## 2018-05-03 NOTE — Telephone Encounter (Signed)
note

## 2018-08-21 ENCOUNTER — Other Ambulatory Visit: Payer: Self-pay | Admitting: Gastroenterology

## 2018-08-31 ENCOUNTER — Telehealth: Payer: Self-pay | Admitting: Gastroenterology

## 2018-08-31 NOTE — Telephone Encounter (Signed)
Pt requested an order put in for his yearly lab works.

## 2018-08-31 NOTE — Telephone Encounter (Signed)
Called and spoke to pt. Last seen i 10-2017.Dr. Loni Muse wants to see him yearly. He will call back in June to schedule appt in July and we can do labs then.  Pt agreed.

## 2018-12-27 ENCOUNTER — Other Ambulatory Visit: Payer: Self-pay | Admitting: Gastroenterology

## 2019-01-22 ENCOUNTER — Other Ambulatory Visit: Payer: Self-pay | Admitting: Gastroenterology

## 2019-02-07 ENCOUNTER — Ambulatory Visit: Payer: 59 | Admitting: Gastroenterology

## 2019-02-07 ENCOUNTER — Other Ambulatory Visit: Payer: Self-pay

## 2019-02-07 ENCOUNTER — Encounter: Payer: Self-pay | Admitting: Gastroenterology

## 2019-02-07 ENCOUNTER — Other Ambulatory Visit (INDEPENDENT_AMBULATORY_CARE_PROVIDER_SITE_OTHER): Payer: 59

## 2019-02-07 VITALS — BP 110/78 | HR 88 | Temp 98.1°F | Ht 70.75 in | Wt 199.5 lb

## 2019-02-07 DIAGNOSIS — K760 Fatty (change of) liver, not elsewhere classified: Secondary | ICD-10-CM

## 2019-02-07 DIAGNOSIS — K219 Gastro-esophageal reflux disease without esophagitis: Secondary | ICD-10-CM | POA: Diagnosis not present

## 2019-02-07 DIAGNOSIS — K5150 Left sided colitis without complications: Secondary | ICD-10-CM

## 2019-02-07 LAB — CBC WITH DIFFERENTIAL/PLATELET
Basophils Absolute: 0.1 10*3/uL (ref 0.0–0.1)
Basophils Relative: 1.1 % (ref 0.0–3.0)
Eosinophils Absolute: 0.3 10*3/uL (ref 0.0–0.7)
Eosinophils Relative: 4.4 % (ref 0.0–5.0)
HCT: 45.5 % (ref 39.0–52.0)
Hemoglobin: 15.4 g/dL (ref 13.0–17.0)
Lymphocytes Relative: 31.8 % (ref 12.0–46.0)
Lymphs Abs: 2 10*3/uL (ref 0.7–4.0)
MCHC: 33.8 g/dL (ref 30.0–36.0)
MCV: 90.2 fl (ref 78.0–100.0)
Monocytes Absolute: 0.6 10*3/uL (ref 0.1–1.0)
Monocytes Relative: 9.4 % (ref 3.0–12.0)
Neutro Abs: 3.3 10*3/uL (ref 1.4–7.7)
Neutrophils Relative %: 53.3 % (ref 43.0–77.0)
Platelets: 274 10*3/uL (ref 150.0–400.0)
RBC: 5.04 Mil/uL (ref 4.22–5.81)
RDW: 13.1 % (ref 11.5–15.5)
WBC: 6.2 10*3/uL (ref 4.0–10.5)

## 2019-02-07 LAB — COMPREHENSIVE METABOLIC PANEL
ALT: 25 U/L (ref 0–53)
AST: 22 U/L (ref 0–37)
Albumin: 4.9 g/dL (ref 3.5–5.2)
Alkaline Phosphatase: 72 U/L (ref 39–117)
BUN: 9 mg/dL (ref 6–23)
CO2: 30 mEq/L (ref 19–32)
Calcium: 9.7 mg/dL (ref 8.4–10.5)
Chloride: 104 mEq/L (ref 96–112)
Creatinine, Ser: 0.88 mg/dL (ref 0.40–1.50)
GFR: 94.45 mL/min (ref >60.00)
Glucose, Bld: 94 mg/dL (ref 70–99)
Potassium: 4.2 mEq/L (ref 3.5–5.1)
Sodium: 139 mEq/L (ref 135–145)
Total Bilirubin: 0.6 mg/dL (ref 0.2–1.2)
Total Protein: 7.5 g/dL (ref 6.0–8.3)

## 2019-02-07 NOTE — Progress Notes (Signed)
HPI :  43 year old male here for a follow-up visit.   Colitis history - diagnosed with left sided UC in 2010. Initially on 4.8gm Lialda daily and then on 2.4gm / day. Earlier in 2020 was transitioned to [facility] due to cost. No prior hospitalizations for colitis. He has been maintained on mesalamine monotherapy with overall good control.   He has been doing pretty well over the past year.  Due to cost he was switched from Lialda to [name].  He is taking 1.5 g a day of this and reports pretty good compliance.  He has not noticed much of a difference between that and the Lialda.  He generally has pretty good control of his bowels, sometimes he has some slight increased urgency and frequency, but generally doing okay.  He cooks all the meals for his family, often uses turmeric as a supplemental therapy which he uses fairly routinely.  He denies any blood in his stools.  No abdominal pains.  He reports he has gained 15 pounds since [place] outbreak, he is not exercising as much and trying to do more of that.  He also has some food sensitivities which cause bowel issues at time and some increased life stressors.  Generally no significant flare since of last seen him.  He drinks occasional alcohol but nothing frequent.  He has been followed for fatty liver in the past, his LFTs have been normal in recent years.  His last colonoscopy was in April 2018 which was grossly normal, biopsies showed normal colon, he was in deep remission.  Hemorrhoids also noted   He otherwise does have reflux which bothers him rarely.  He this is mostly diet controlled and has no dysphagia or alarm symptoms.  He does not want to take anything for his reflux.  Colonoscopy 07/29/2016 - normal colon, hemorrhoids - path shows normal colon   Past Medical History:  Diagnosis Date  . Fatty liver   . GERD (gastroesophageal reflux disease)   . Ulcerative colitis      Past Surgical History:  Procedure Laterality Date  . COLONOSCOPY     . VASECTOMY     Family History  Problem Relation Age of Onset  . Diabetes Mother   . Heart disease Mother   . Diabetes Father   . Heart disease Maternal Uncle   . Colon cancer Neg Hx   . Colon polyps Neg Hx   . Esophageal cancer Neg Hx   . Rectal cancer Neg Hx   . Stomach cancer Neg Hx    Social History   Tobacco Use  . Smoking status: Former Smoker    Quit date: 04/19/1998    Years since quitting: 20.8  . Smokeless tobacco: Never Used  Substance Use Topics  . Alcohol use: No  . Drug use: No   Current Outpatient Medications  Medication Sig Dispense Refill  . MAGNESIUM MALATE PO Take 425 mg by mouth daily.    . mesalamine (APRISO) 0.375 g 24 hr capsule TAKE FOUR CAPSULES BY MOUTH EVERY MORNING 120 capsule 0  . Multiple Vitamin (MULTIVITAMIN) tablet Take 1 tablet by mouth daily.    . Probiotic Product (PROBIOTIC PO) Take 1 tablet by mouth daily.     No current facility-administered medications for this visit.    Allergies  Allergen Reactions  . Penicillins      Review of Systems: All systems reviewed and negative except where noted in HPI.    No results found.  Physical Exam: BP 110/78 (  BP Location: Left Arm, Patient Position: Sitting, Cuff Size: Normal)   Pulse 88   Temp 98.1 F (36.7 C)   Ht 5' 10.75" (1.797 m)   Wt 199 lb 8 oz (90.5 kg)   BMI 28.02 kg/m  Constitutional: Pleasant,well-developed, male in no acute distress. HEENT: Normocephalic and atraumatic. Conjunctivae are normal. No scleral icterus. Neck supple.  Cardiovascular: Normal rate, regular rhythm.  Pulmonary/chest: Effort normal and breath sounds normal. No wheezing, rales or rhonchi. Abdominal: Soft, nondistended, nontender.  There are no masses palpable. No hepatomegaly. Extremities: no edema Lymphadenopathy: No cervical adenopathy noted. Neurological: Alert and oriented to person place and time. Skin: Skin is warm and dry. No rashes noted. Psychiatric: Normal mood and affect. Behavior  is normal.   ASSESSMENT AND PLAN: 43 year old male here for reassessment of following issues:  Left sided UC -now on Apriso monotherapy and doing fairly well.  He states some occasional increased frequency and urgency, he thinks more related to food intolerance /diet, decreased exercise and life stressors.  His last colonoscopy showed that he was in deep remission.  We will continue his present regimen with Apriso and turmeric.  I will obtain basic labs today to ensure and stable.  I will also check a fecal calprotectin to ensure okay even symptom is mild symptoms.  If he does need additional therapy for his colitis, would recommend adding Rowasa enemas given the location of his disease.  He wants to avoid escalation of therapy if possible.  Will await his labs and fecal calprotectin.  Consider surveillance colonoscopy in the next year or so.  He agreed  Fatty liver - LFTs normal previously, he is working on weight loss, minimizing alcohol use, will repeat LFTs now.  GERD - controlled with diet, he can contact me if he needs anything for this in the future.  [place] Cellar, MD [facility]

## 2019-02-07 NOTE — Patient Instructions (Signed)
If you are age 43 or older, your body mass index should be between 23-30. Your Body mass index is 28.02 kg/m. If this is out of the aforementioned range listed, please consider follow up with your Primary Care Provider.  If you are age 87 or younger, your body mass index should be between 19-25. Your Body mass index is 28.02 kg/m. If this is out of the aformentioned range listed, please consider follow up with your Primary Care Provider.   To help prevent the possible spread of infection to our patients, communities, and staff; we will be implementing the following measures:  As of now we are not allowing any visitors/family members to accompany you to any upcoming appointments with [facility]. If you have any concerns about this please contact our office to discuss prior to the appointment.   Please go to the lab in the basement of our building to have lab work done as you leave today. Hit "B" for basement when you get on the elevator.  When the doors open the lab is on your left.  We will call you with the results. Thank you.  Continue your medications.  Thank you for entrusting me with your care and for choosing [facility], Dr. [place] Cellar

## 2019-03-12 ENCOUNTER — Other Ambulatory Visit: Payer: Self-pay

## 2019-03-12 ENCOUNTER — Other Ambulatory Visit: Payer: Self-pay | Admitting: Gastroenterology

## 2019-06-05 ENCOUNTER — Other Ambulatory Visit: Payer: Self-pay | Admitting: Gastroenterology

## 2019-06-06 ENCOUNTER — Telehealth: Payer: Self-pay | Admitting: Gastroenterology

## 2019-06-06 NOTE — Telephone Encounter (Signed)
Edward Dougherty from [facility] informed that pharmacy is unable to order [name].

## 2019-06-06 NOTE — Telephone Encounter (Signed)
Called and spoke to pharmacist at [facility].  They can't get the Apriso so they transferred the script to [facility] in [facility]. Patient informed.

## 2019-11-29 ENCOUNTER — Other Ambulatory Visit: Payer: Self-pay | Admitting: Gastroenterology

## 2020-03-02 ENCOUNTER — Other Ambulatory Visit: Payer: Self-pay | Admitting: Gastroenterology

## 2020-04-28 ENCOUNTER — Other Ambulatory Visit: Payer: Self-pay | Admitting: Gastroenterology

## 2020-04-28 ENCOUNTER — Telehealth: Payer: Self-pay | Admitting: Gastroenterology

## 2020-04-28 ENCOUNTER — Other Ambulatory Visit: Payer: Self-pay

## 2020-04-28 NOTE — Telephone Encounter (Signed)
Refill  Sent for 1 month

## 2020-05-13 ENCOUNTER — Ambulatory Visit: Payer: 59 | Admitting: Gastroenterology

## 2020-05-23 ENCOUNTER — Other Ambulatory Visit: Payer: Self-pay | Admitting: Gastroenterology

## 2020-06-18 ENCOUNTER — Ambulatory Visit: Payer: 59 | Admitting: Gastroenterology

## 2020-06-18 ENCOUNTER — Encounter: Payer: Self-pay | Admitting: Gastroenterology

## 2020-06-18 VITALS — BP 100/70 | HR 124 | Ht 70.25 in | Wt 190.5 lb

## 2020-06-18 DIAGNOSIS — K760 Fatty (change of) liver, not elsewhere classified: Secondary | ICD-10-CM | POA: Diagnosis not present

## 2020-06-18 DIAGNOSIS — K5150 Left sided colitis without complications: Secondary | ICD-10-CM | POA: Diagnosis not present

## 2020-06-18 MED ORDER — MESALAMINE ER 0.375 G PO CP24: 1.5000 g | ORAL_CAPSULE | Freq: Every day | ORAL | 3 refills | Status: DC

## 2020-06-18 NOTE — Patient Instructions (Addendum)
If you are age 45 or older, your body mass index should be between 23-30. Your Body mass index is 27.14 kg/m. If this is out of the aforementioned range listed, please consider follow up with your Primary Care Provider.  If you are age 21 or younger, your body mass index should be between 19-25. Your Body mass index is 27.14 kg/m. If this is out of the aformentioned range listed, please consider follow up with your Primary Care Provider.    Please go to the lab in the basement of our building to have lab work done as you leave today. Hit "B" for basement when you get on the elevator.  When the doors open the lab is on your left.  We will call you with the results. Thank you.  Due to recent changes in healthcare laws, you may see the results of your imaging and laboratory studies on MyChart before your provider has had a chance to review them.  We understand that in some cases there may be results that are confusing or concerning to you. Not all laboratory results come back in the same time frame and the provider may be waiting for multiple results in order to interpret others.  Please give [place] 48 hours in order for your provider to thoroughly review all the results before contacting the office for clarification of your results.   We have sent the following medications to your pharmacy for you to pick up at your convenience: Apriso 1.5 g daily  Please follow up in 1 year.  Thank you for entrusting me with your care and for choosing [facility], Dr. Burley Cellar

## 2020-06-18 NOTE — Progress Notes (Signed)
HPI :  Colitis history - diagnosed with left sided UC in 2010. Initially on 4.8gm Lialda daily and then on 2.4gm / day. Earlier in 2020 was transitioned to [facility] due to cost. No prior hospitalizations for colitis.He has been maintained on mesalamine monotherapy with overall good control.   Since last visit: Patient here for a follow-up visit for his colitis.  He has not been seen since October 2020.  For the most part his colitis has been fairly well controlled on the Apriso to which she is compliant.  He had what sounds like a severe case of Covid this past January which altered his bowel habits for period of time, actually made him more constipated, however stools gradually getting back to normal.  He was given a course of prednisone for his COVID, not for his colitis.  He has been supplementing with turmeric as well for his colitis and states he thinks this helped some.  No blood in his stools.  His stool frequency can vary.  No abdominal pains.  He is trying to get back into exercising more now that he is recovering from Covid.  He does have a history of fatty liver which has been monitored over time, last set of LFTs were normal.  His last colonoscopy was almost 4 years ago which looked normal at that time on therapy.  He is now had disease for about 12 years and we discussed how frequently want to have surveillance exams.  He had plan to do a fecal calprotectin after our last visit over a year ago but never got around to doing it.  Colonoscopy 07/29/2016 - normal colon, hemorrhoids - path shows normal colon   Past Medical History:  Diagnosis Date  . COVID-19   . Fatty liver   . GERD (gastroesophageal reflux disease)   . Ulcerative colitis      Past Surgical History:  Procedure Laterality Date  . COLONOSCOPY    . VASECTOMY     Family History  Problem Relation Age of Onset  . Diabetes Mother   . Heart disease Mother   . Diabetes Father   . Heart disease Maternal Uncle   .  Colon cancer Neg Hx   . Colon polyps Neg Hx   . Esophageal cancer Neg Hx   . Rectal cancer Neg Hx   . Stomach cancer Neg Hx    Social History   Tobacco Use  . Smoking status: Former Smoker    Quit date: 04/19/1998    Years since quitting: 22.1  . Smokeless tobacco: Never Used  Substance Use Topics  . Alcohol use: No  . Drug use: No   Current Outpatient Medications  Medication Sig Dispense Refill  . MAGNESIUM MALATE PO Take 425 mg by mouth daily.    . mesalamine (APRISO) 0.375 g 24 hr capsule TAKE 4 CAPSULES BY MOUTH DAILY. PLEASE KEEP YOUR UPCOMING APPT FOR FURTHER REFILLS. THANK YOU 120 capsule 0  . Multiple Vitamin (MULTIVITAMIN) tablet Take 1 tablet by mouth daily.    . Probiotic Product (PROBIOTIC PO) Take 1 tablet by mouth daily.    . TURMERIC PO Take 1 capsule by mouth daily.     No current facility-administered medications for this visit.   Allergies  Allergen Reactions  . Penicillins      Review of Systems: All systems reviewed and negative except where noted in HPI.   Labs reviewed in Epic  Physical Exam: BP 100/70 (BP Location: Left Arm, Patient Position: Sitting,  Cuff Size: Normal)   Pulse (!) 124   Ht 5' 10.25" (1.784 m) Comment: height measured without shoes  Wt 190 lb 8 oz (86.4 kg)   BMI 27.14 kg/m  Constitutional: Pleasant,well-developed, male in no acute distress. Neurological: Alert and oriented to person place and time. Psychiatric: Normal mood and affect. Behavior is normal.   ASSESSMENT AND PLAN: 45 year old male here for reassessment of the following:  Left-sided ulcerative colitis Fatty liver  Generally doing pretty well on mesalamine monotherapy over the past few years, specifically on Apriso which he tolerates well.  Last colonoscopy was 4 years ago when he had no active inflammation.  He had some altered bowel habits in the setting of Covid infection in recent months but has since been feeling back to normal, using Tumeric as a  supplement.  He is due for yearly labs and will have that done.  I also discussed whether he wanted to do a surveillance colonoscopy now in light of his duration of disease which I think is reasonable given his last exam was 4 years ago.  We discussed colonoscopy and risks and benefits.  He is hoping to hold off on that for the time being but wants to do it within the next year.  He will call to schedule when he is ready.  In the interim he is agreeable to submit a fecal calprotectin to assess for active inflammation.  I will continue to see him yearly.  He will resume exercise, will recheck LFTs in light of history of fatty liver.  He agreed  Bellamy Cellar, MD [facility] Gastroenterology

## 2021-02-12 ENCOUNTER — Telehealth: Payer: Self-pay | Admitting: Anesthesiology

## 2021-02-12 DIAGNOSIS — K760 Fatty (change of) liver, not elsewhere classified: Secondary | ICD-10-CM

## 2021-02-12 DIAGNOSIS — K5150 Left sided colitis without complications: Secondary | ICD-10-CM

## 2021-02-12 MED ORDER — ORTIKOS 9 MG PO CP24: 1.0000 | ORAL_CAPSULE | Freq: Every day | ORAL | 0 refills | Status: DC

## 2021-02-12 NOTE — Telephone Encounter (Signed)
Spoke with patient, he reports that he has been having a UC flare since the summer. Patient states that it has been fluctuating and he has been trying to keep it under control on his own. Pt states that he is trying lose some weight and get back into exercising. He states that for the past 2 week she has been having blood and mucous in his stool. Pt states that he notices fresh, BRB when wiping and in the commode water, not on the stool itself. He reports some urgency and fatigue. No abdominal cramping. He reports that he is still taking Mesalamine 1.5 g total daily (4 capsules). Please advise, thanks.

## 2021-02-12 NOTE — Telephone Encounter (Signed)
Sorry to hear this. He can try increasing Apriso to 3 gm / day for a few weeks (double dosing of what he is taking now) and see if that helps. Other options to treat his flare would be adding Rowasa enemas, or Hydrocortisone suppositories, versus course of Uceris / Ortikos or Prednisone. If we have free samples of Ortikos at the front desk that may be a reasonable option, 40m / day for 1 month, if he wants to try that. I would recommend he come into the office for a follow up visit as well to discuss long term plan, if you can help book that as well. Thanks

## 2021-02-12 NOTE — Telephone Encounter (Signed)
Thanks [employer]. Yes he is due for CBC and CMET if you can help coordinate. Thanks

## 2021-02-12 NOTE — Telephone Encounter (Signed)
Spoke with patient in regards to recommendations. Pt would like to try Ortikos for a month and see how he does with that. Pt will come by the office tomorrow to pick up samples (2nd floor).  Pt has been scheduled for a follow up appt on Monday, 03/16/21 at 1:20 pm. Pt would like to know if he needs blood work as well so that he can have it all done at once. Please advise, thanks.  1 box of Ortikos sample left at 2nd floor front desk  LOT# [id number] EXP: 01/2022

## 2021-02-12 NOTE — Telephone Encounter (Signed)
Patient called and is having a flare-up of his UC and wanted to know if something could be called in to help with symptoms.  Please call and advise.

## 2021-02-13 ENCOUNTER — Other Ambulatory Visit (INDEPENDENT_AMBULATORY_CARE_PROVIDER_SITE_OTHER): Payer: 59

## 2021-02-13 DIAGNOSIS — K5150 Left sided colitis without complications: Secondary | ICD-10-CM

## 2021-02-13 DIAGNOSIS — K760 Fatty (change of) liver, not elsewhere classified: Secondary | ICD-10-CM | POA: Diagnosis not present

## 2021-02-13 LAB — COMPREHENSIVE METABOLIC PANEL
ALT: 21 U/L (ref 0–53)
AST: 18 U/L (ref 0–37)
Albumin: 4.6 g/dL (ref 3.5–5.2)
Alkaline Phosphatase: 70 U/L (ref 39–117)
BUN: 8 mg/dL (ref 6–23)
CO2: 28 mEq/L (ref 19–32)
Calcium: 9.3 mg/dL (ref 8.4–10.5)
Chloride: 106 mEq/L (ref 96–112)
Creatinine, Ser: 0.94 mg/dL (ref 0.40–1.50)
GFR: 98.13 mL/min (ref >60.00)
Glucose, Bld: 93 mg/dL (ref 70–99)
Potassium: 4.4 mEq/L (ref 3.5–5.1)
Sodium: 141 mEq/L (ref 135–145)
Total Bilirubin: 0.7 mg/dL (ref 0.2–1.2)
Total Protein: 7.3 g/dL (ref 6.0–8.3)

## 2021-02-13 LAB — CBC
HCT: 45.3 % (ref 39.0–52.0)
Hemoglobin: 15.1 g/dL (ref 13.0–17.0)
MCHC: 33.2 g/dL (ref 30.0–36.0)
MCV: 90.6 fl (ref 78.0–100.0)
Platelets: 273 10*3/uL (ref 150.0–400.0)
RBC: 5 Mil/uL (ref 4.22–5.81)
RDW: 13.3 % (ref 11.5–15.5)
WBC: 5.6 10*3/uL (ref 4.0–10.5)

## 2021-02-13 NOTE — Telephone Encounter (Signed)
Lm on vm for patient to return call.  Lab orders in epic.

## 2021-02-13 NOTE — Telephone Encounter (Signed)
Pt returned call, he is aware that he can go by the lab when he comes in to get the [place] samples. Pt verbalized understanding and had no concerns at the end of the call.

## 2021-02-23 ENCOUNTER — Other Ambulatory Visit: Payer: 59

## 2021-02-23 DIAGNOSIS — K760 Fatty (change of) liver, not elsewhere classified: Secondary | ICD-10-CM

## 2021-02-23 DIAGNOSIS — K5150 Left sided colitis without complications: Secondary | ICD-10-CM

## 2021-02-24 ENCOUNTER — Ambulatory Visit: Payer: 59 | Admitting: Gastroenterology

## 2021-02-26 LAB — CALPROTECTIN, FECAL: Calprotectin, Fecal: 92 ug/g (ref 0–120)

## 2021-03-16 ENCOUNTER — Ambulatory Visit: Payer: 59 | Admitting: Gastroenterology

## 2021-03-16 ENCOUNTER — Encounter: Payer: Self-pay | Admitting: Gastroenterology

## 2021-03-16 VITALS — BP 118/80 | HR 110 | Ht 70.0 in | Wt 202.0 lb

## 2021-03-16 DIAGNOSIS — K51511 Left sided colitis with rectal bleeding: Secondary | ICD-10-CM | POA: Diagnosis not present

## 2021-03-16 MED ORDER — SUTAB 1479-225-188 MG PO TABS: 1.0000 | ORAL_TABLET | Freq: Once | ORAL | 0 refills | Status: AC

## 2021-03-16 MED ORDER — MESALAMINE 1000 MG RE SUPP: 1000.0000 mg | Freq: Every day | RECTAL | 3 refills | Status: DC

## 2021-03-16 MED ORDER — MESALAMINE ER 0.375 G PO CP24: 3.0000 g | ORAL_CAPSULE | Freq: Every day | ORAL | 1 refills | Status: DC

## 2021-03-16 MED ORDER — AMBULATORY NON FORMULARY MEDICATION: Status: DC

## 2021-03-16 NOTE — Progress Notes (Signed)
HPI :  Colitis history - diagnosed with left sided UC in 2010. Initially on 4.8gm Lialda daily and then on 2.4gm / day. Earlier in 2020 was transitioned to [facility] due to cost. No prior hospitalizations for colitis. He has been maintained on mesalamine monotherapy with overall good control.    Since last visit: Patient was last seen in March for routine follow-up.  At that point he was doing pretty well on his current regimen.  He states this past June he noticed some "activity" and symptoms.  He states in October things got a bit worse.  He feels like he is having a mild flare of symptoms.  He is having roughly 5 bowel movements per day.  Stool can vary and formed, some normal, somewhat loose.  He has been seeing blood in most of his stools as well as some mucus and some associated urgency.  He has rare nocturnal symptoms.  No abdominal pains, occasional gas and bloating.  He actually gained few pounds since of last seen him, no weight loss.  He is not using any NSAIDs, using only Tylenol as needed for aches and pains.  He is currently taking Apriso 1.5 g/day.  He called in recent months about his symptoms and I recommended increasing to 3 g a day but has not done that.  I did give him a free sample of Ortikos for the past month, his last dose was this past weekend.  He states it is really hard to say if it provided any benefit, he has not noticed any significant change.  He states things feel mildly active and not yet in remission.  He has used turmeric in the past but not reliably.  His last colonoscopy was in 2018.  Prior workup: Colonoscopy 07/29/2016 - normal colon, hemorrhoids - path shows normal colon   Fecal calprotectin 02/23/21 - level of 92 CBC and CMET done on 10/28 0 Hgb 15.1, WBC 5.6   Past Medical History:  Diagnosis Date   COVID-19    Fatty liver    GERD (gastroesophageal reflux disease)    Ulcerative colitis      Past Surgical History:  Procedure Laterality Date    COLONOSCOPY     VASECTOMY     Family History  Problem Relation Age of Onset   Diabetes Mother    Heart disease Mother    Diabetes Father    Heart disease Maternal Uncle    Colon cancer Neg Hx    Colon polyps Neg Hx    Esophageal cancer Neg Hx    Rectal cancer Neg Hx    Stomach cancer Neg Hx    Social History   Tobacco Use   Smoking status: Former    Types: Cigarettes    Quit date: 04/19/1998    Years since quitting: 22.9   Smokeless tobacco: Never  Substance Use Topics   Alcohol use: No   Drug use: No   Current Outpatient Medications  Medication Sig Dispense Refill   Budesonide ER (ORTIKOS) 9 MG CP24 Take 1 capsule by mouth daily. 30 capsule 0   MAGNESIUM MALATE PO Take 425 mg by mouth daily.     mesalamine (APRISO) 0.375 g 24 hr capsule Take 4 capsules (1.5 g total) by mouth daily. 360 capsule 3   Multiple Vitamin (MULTIVITAMIN) tablet Take 1 tablet by mouth daily.     Probiotic Product (PROBIOTIC PO) Take 1 tablet by mouth daily.     TURMERIC PO Take 1 capsule by  mouth daily.     No current facility-administered medications for this visit.   Allergies  Allergen Reactions   Penicillins      Review of Systems: All systems reviewed and negative except where noted in HPI.   Lab Results  Component Value Date   WBC 5.6 02/13/2021   HGB 15.1 02/13/2021   HCT 45.3 02/13/2021   MCV 90.6 02/13/2021   PLT 273.0 02/13/2021    Lab Results  Component Value Date   CREATININE 0.94 02/13/2021   BUN 8 02/13/2021   NA 141 02/13/2021   K 4.4 02/13/2021   CL 106 02/13/2021   CO2 28 02/13/2021    Lab Results  Component Value Date   ALT 21 02/13/2021   AST 18 02/13/2021   ALKPHOS 70 02/13/2021   BILITOT 0.7 02/13/2021     Physical Exam: BP 118/80   Pulse (!) 110   Ht 5' 10"  (1.778 m)   Wt 202 lb (91.6 kg)   BMI 28.98 kg/m  Constitutional: Pleasant,well-developed, male in no acute distress. Neurological: Alert and oriented to person place and  time. Psychiatric: Normal mood and affect. Behavior is normal.   ASSESSMENT AND PLAN: 45 year old male here for reassessment of following:  Left-sided ulcerative colitis with bleeding  As above, few months worth of with the patient feels as active colitis with some urgency, bleeding, loose stool.  Trial of Ortikos did not provide any significant change.  His fecal calprotectin is borderline and his CBC shows no anemia or leukocytosis.  At this point time we need to clarify if his colitis is causing his flare of symptoms.  Based on symptoms it sounds consistent with his typical flare.  Colonoscopy is recommended at this time to clarify how much disease activity he has and where this is located.  It sounds like it is possible he may does have some proctitis that is active.  He is agreeable to colonoscopy after discussion of risks and benefits.  In interim I recommend he increase his mesalamine to 3gm per day, and that we add some topical rectal therapy.  I discussed Canasa suppository versus Rowasa enemas with him.  Due to ease of dosing he would prefer to try suppositories first.  He is agreeable to this.  We will also add curcumin supplement, and reviewed data that this can help reduce risk for flares when combined with mesalamine.  His colonoscopy is scheduled in January, we will see if he has active disease despite high risk for all mesalamine and rectal mesalamine.  If so and symptoms persist along with this, we may need to escalate his regimen.  He hopes to avoid that if possible.  We will await colonoscopy in his course.  Plan: - colonoscopy to restage disease on higher dose mesalamine  - increase Mesalamine - 3gm / day - 8 tablets per day - add Canasa suppository q HS #30 RF3 - if Canasa too expensive, will try Rowasa - add curcumin supplementation - 2-3gm / day  Call in the interim if worsening symptoms despite change in regimen.  Jolly Mango, MD [facility] Gastroenterology

## 2021-03-16 NOTE — Patient Instructions (Addendum)
If you are age 45 or older, your body mass index should be between 23-30. Your Body mass index is 28.98 kg/m. If this is out of the aforementioned range listed, please consider follow up with your Primary Care Provider.  If you are age 11 or younger, your body mass index should be between 19-25. Your Body mass index is 28.98 kg/m. If this is out of the aformentioned range listed, please consider follow up with your Primary Care Provider.   ________________________________________________________  The  GI providers would like to encourage you to use [facility] to communicate with providers for non-urgent requests or questions.  Due to long hold times on the telephone, sending your provider a message by [facility] may be a faster and more efficient way to get a response.  Please allow 48 business hours for a response.  Please remember that this is for non-urgent requests.  _______________________________________________________   Dennis Bast have been scheduled for a colonoscopy. Please follow written instructions given to you at your visit today.  Please pick up your prep supplies at the pharmacy within the next 1-3 days. If you use inhalers (even only as needed), please bring them with you on the day of your procedure.   Increase your Apriso to 3g per day (8 capsules).  A refill has been sent to your pharmacy.  We have sent the following medications to your pharmacy for you to pick up at your convenience: Canasa suppository: Insert 1 suppository rectally at bedtime  Please purchase the following medications over the counter and take as directed: Curcumin: Take 2 to 3 grams daily  Thank you for entrusting me with your care and for choosing [employer], Dr. [place] Cellar

## 2021-05-04 ENCOUNTER — Encounter: Payer: Self-pay | Admitting: Gastroenterology

## 2021-05-08 ENCOUNTER — Ambulatory Visit (AMBULATORY_SURGERY_CENTER): Payer: 59 | Admitting: Gastroenterology

## 2021-05-08 ENCOUNTER — Encounter: Payer: Self-pay | Admitting: Gastroenterology

## 2021-05-08 VITALS — BP 100/70 | HR 71 | Temp 98.2°F | Resp 14 | Ht 70.0 in | Wt 202.0 lb

## 2021-05-08 DIAGNOSIS — K529 Noninfective gastroenteritis and colitis, unspecified: Secondary | ICD-10-CM

## 2021-05-08 DIAGNOSIS — K6289 Other specified diseases of anus and rectum: Secondary | ICD-10-CM

## 2021-05-08 DIAGNOSIS — K5150 Left sided colitis without complications: Secondary | ICD-10-CM | POA: Diagnosis present

## 2021-05-08 MED ORDER — MESALAMINE 4 G RE ENEM: 4.0000 g | ENEMA | Freq: Every day | RECTAL | Status: DC

## 2021-05-08 MED ORDER — SODIUM CHLORIDE 0.9 % IV SOLN: 500.0000 mL | Freq: Once | INTRAVENOUS | Status: DC

## 2021-05-08 NOTE — Progress Notes (Signed)
[facility] History and Physical   Primary Care Physician:  Herbert Pun., MD (Inactive)   Reason for Procedure:   Left sided colitis  Plan:    colonoscopy     HPI: Edward Dougherty is a 46 y.o. male  here for colonoscopy surveillance of left sided UC. Had flare in November - on oral and rectal mesalamine and symptoms improved since adding Canasa and increasing Apriso. No family history of colon cancer known. Otherwise feels well without any cardiopulmonary symptoms.    Past Medical History:  Diagnosis Date   COVID-19    Fatty liver    GERD (gastroesophageal reflux disease)    Ulcerative colitis     Past Surgical History:  Procedure Laterality Date   COLONOSCOPY     VASECTOMY      Prior to Admission medications   Medication Sig Start Date End Date Taking? Authorizing Provider  AMBULATORY NON FORMULARY MEDICATION Medication Name: curcumin supplement: Take 2 to 3 grams daily 03/16/21  Yes Chasitty Hehl, Carlota Raspberry, MD  MAGNESIUM MALATE PO Take 425 mg by mouth daily.   Yes [provider]  mesalamine (APRISO) 0.375 g 24 hr capsule Take 8 capsules (3 g total) by mouth daily. 03/16/21  Yes Media Pizzini, Carlota Raspberry, MD  mesalamine (CANASA) 1000 MG suppository Place 1 suppository (1,000 mg total) rectally at bedtime. 03/16/21  Yes Jenisis Harmsen, Carlota Raspberry, MD  Multiple Vitamin (MULTIVITAMIN) tablet Take 1 tablet by mouth daily.   Yes [provider]  Probiotic Product (PROBIOTIC PO) Take 1 tablet by mouth daily.   Yes [provider]  TURMERIC PO Take 1 capsule by mouth daily.   Yes [provider]    Current Outpatient Medications  Medication Sig Dispense Refill   AMBULATORY NON FORMULARY MEDICATION Medication Name: curcumin supplement: Take 2 to 3 grams daily     MAGNESIUM MALATE PO Take 425 mg by mouth daily.     mesalamine (APRISO) 0.375 g 24 hr capsule Take 8 capsules (3 g total) by mouth daily. 720 capsule 1   mesalamine (CANASA) 1000  MG suppository Place 1 suppository (1,000 mg total) rectally at bedtime. 30 suppository 3   Multiple Vitamin (MULTIVITAMIN) tablet Take 1 tablet by mouth daily.     Probiotic Product (PROBIOTIC PO) Take 1 tablet by mouth daily.     TURMERIC PO Take 1 capsule by mouth daily.     Current Facility-Administered Medications  Medication Dose Route Frequency Provider Last Rate Last Admin   0.9 %  sodium chloride infusion  500 mL Intravenous Once Genean Adamski, Carlota Raspberry, MD        Allergies as of 05/08/2021 - Review Complete 05/08/2021  Allergen Reaction Noted   Penicillins      Family History  Problem Relation Age of Onset   Diabetes Mother    Heart disease Mother    Diabetes Father    Heart disease Maternal Uncle    Colon cancer Neg Hx    Colon polyps Neg Hx    Esophageal cancer Neg Hx    Rectal cancer Neg Hx    Stomach cancer Neg Hx     Social History   Socioeconomic History   Marital status: Married    Spouse name: Not on file   Number of children: Not on file   Years of education: Not on file   Highest education level: Not on file  Occupational History   Occupation: Employed    Comment: Designer, multimedia   Tobacco Use  Smoking status: Former    Types: Cigarettes    Quit date: 04/19/1998    Years since quitting: 23.0   Smokeless tobacco: Never  Vaping Use   Vaping Use: Never used  Substance and Sexual Activity   Alcohol use: No   Drug use: No   Sexual activity: Not on file  Other Topics Concern   Not on file  Social History Narrative   Not on file   Social Determinants of Health   Financial Resource Strain: Not on file  Food Insecurity: Not on file  Transportation Needs: Not on file  Physical Activity: Not on file  Stress: Not on file  Social Connections: Not on file  Intimate Partner Violence: Not on file    Review of Systems: All other review of systems negative except as mentioned in the HPI.  Physical Exam: Vital signs BP 119/73    Pulse 76    Temp  98.2 F (36.8 C) (Skin)    Resp 20    Ht 5' 10"  (1.778 m)    Wt 202 lb (91.6 kg)    SpO2 100%    BMI 28.98 kg/m   General:   Alert,  Well-developed, pleasant and cooperative in NAD Lungs:  Clear throughout to auscultation.   Heart:  Regular rate and rhythm Abdomen:  Soft, nontender and nondistended.   Neuro/Psych:  Alert and cooperative. Normal mood and affect. A and O x 3  Jolly Mango, MD [facility] Gastroenterology

## 2021-05-08 NOTE — Op Note (Signed)
[facility] Patient Name: Edward Dougherty Procedure Date: 05/08/2021 1:28 PM MRN: [id number] Endoscopist: Remo Lipps P. Havery Moros , MD Age: 46 Referring MD:  Date of Birth: [birth year] Gender: Male Account #: [id number] Procedure:                Colonoscopy Indications:              Follow-up of left-sided chronic ulcerative colitis                            - historically well controlled with mesalamine,                            flare this past November - on higher dose Apriso                            and Canasa suppositories added with improvement in                            symptoms Medicines:                Monitored Anesthesia Care Procedure:                Pre-Anesthesia Assessment:                           - Prior to the procedure, a History and Physical                            was performed, and patient medications and                            allergies were reviewed. The patient's tolerance of                            previous anesthesia was also reviewed. The risks                            and benefits of the procedure and the sedation                            options and risks were discussed with the patient.                            All questions were answered, and informed consent                            was obtained. Prior Anticoagulants: The patient has                            taken no previous anticoagulant or antiplatelet                            agents. ASA Grade Assessment: II - A patient with  mild systemic disease. After reviewing the risks                            and benefits, the patient was deemed in                            satisfactory condition to undergo the procedure.                           After obtaining informed consent, the colonoscope                            was passed under direct vision. Throughout the                            procedure, the patient's blood pressure, pulse, and                             oxygen saturations were monitored continuously. The                            CF [id number] #[id number] was introduced through the anus                            and advanced to the the terminal ileum, with                            identification of the appendiceal orifice and IC                            valve. The colonoscopy was performed without                            difficulty. The patient tolerated the procedure                            well. The quality of the bowel preparation was                            good. The terminal ileum, ileocecal valve,                            appendiceal orifice, and rectum were photographed. Scope In: 1:35:13 PM Scope Out: 1:51:16 PM Scope Withdrawal Time: 0 hours 13 minutes 12 seconds  Total Procedure Duration: 0 hours 16 minutes 3 seconds  Findings:                 The perianal and digital rectal examinations were                            normal.                           The terminal ileum appeared normal.  Inflammation was found in a continuous and                            circumferential pattern from the rectum to the                            sigmoid colon, around 25cm from the anal verge.                            This was graded as Mayo Score 1 (mild, with                            erythema, decreased vascular pattern, mild                            friability). Rectal area was very mildly inflamed,                            slightly worse in the rectosigmoid and distal                            sigmoid colon. Biopsies were taken with a cold                            forceps for histology.                           Internal hemorrhoids were found during                            retroflexion. The hemorrhoids were small.                           The exam was otherwise without abnormality.                           Biopsies were taken with a cold forceps in the                             descending colon for histology. Complications:            No immediate complications. Estimated blood loss:                            Minimal. Estimated Blood Loss:     Estimated blood loss was minimal. Impression:               - The examined portion of the ileum was normal.                           - Mild (Mayo Score 1) left-sided ulcerative colitis                            from rectum to 25cm from anal verge. Biopsied.                           -  Internal hemorrhoids.                           - The examination was otherwise normal.                           - Biopsies were taken with a cold forceps for                            histology in the descending colon.                           Unfortunately the patient has persistent mildly                            active disease despite higher dose oral mesalamine                            and Canasa. Can consider switching Canasa to Rowasa                            enemas to get more proximal covered but not                            convinced this will work to keep him in remission.                            Will need to discuss biologic therapy, will discuss                            options with the patient. Recommendation:           - Patient has a contact number available for                            emergencies. The signs and symptoms of potential                            delayed complications were discussed with the                            patient. Return to normal activities tomorrow.                            Written discharge instructions were provided to the                            patient.                           - Resume previous diet.                           - Continue present medications.                           -  Await pathology results. Remo Lipps P. Celie Desrochers, MD 05/08/2021 1:57:29 PM This report has been signed electronically.

## 2021-05-08 NOTE — Progress Notes (Signed)
VS- [name]. °

## 2021-05-08 NOTE — Patient Instructions (Signed)
Information on hemorrhoids given to you today.  Await pathology results from the biopsies taken today.  Resume previous diet and medications.   YOU HAD AN ENDOSCOPIC PROCEDURE TODAY AT [facility]:   Refer to the procedure report that was given to you for any specific questions about what was found during the examination.  If the procedure report does not answer your questions, please call your gastroenterologist to clarify.  If you requested that your care partner not be given the details of your procedure findings, then the procedure report has been included in a sealed envelope for you to review at your convenience later.  YOU SHOULD EXPECT: Some feelings of bloating in the abdomen. Passage of more gas than usual.  Walking can help get rid of the air that was put into your GI tract during the procedure and reduce the bloating. If you had a lower endoscopy (such as a colonoscopy or flexible sigmoidoscopy) you may notice spotting of blood in your stool or on the toilet paper. If you underwent a bowel prep for your procedure, you may not have a normal bowel movement for a few days.  Please Note:  You might notice some irritation and congestion in your nose or some drainage.  This is from the oxygen used during your procedure.  There is no need for concern and it should clear up in a day or so.  SYMPTOMS TO REPORT IMMEDIATELY:  Following lower endoscopy (colonoscopy or flexible sigmoidoscopy):  Excessive amounts of blood in the stool  Significant tenderness or worsening of abdominal pains  Swelling of the abdomen that is new, acute  Fever of 100F or higher  For urgent or emergent issues, a gastroenterologist can be reached at any hour by calling [phone]. Do not use MyChart messaging for urgent concerns.    DIET:  We do recommend a small meal at first, but then you may proceed to your regular diet.  Drink plenty of fluids but you should avoid alcoholic beverages for 24  hours.  ACTIVITY:  You should plan to take it easy for the rest of today and you should NOT DRIVE or use heavy machinery until tomorrow (because of the sedation medicines used during the test).    FOLLOW UP: Our staff will call the number listed on your records 48-72 hours following your procedure to check on you and address any questions or concerns that you may have regarding the information given to you following your procedure. If we do not reach you, we will leave a message.  We will attempt to reach you two times.  During this call, we will ask if you have developed any symptoms of COVID 19. If you develop any symptoms (ie: fever, flu-like symptoms, shortness of breath, cough etc.) before then, please call [phone].  If you test positive for Covid 19 in the 2 weeks post procedure, please call and report this information to [place].    If any biopsies were taken you will be contacted by phone or by letter within the next 1-3 weeks.  Please call us at [phone] if you have not heard about the biopsies in 3 weeks.    SIGNATURES/CONFIDENTIALITY: You and/or your care partner have signed paperwork which will be entered into your electronic medical record.  These signatures attest to the fact that that the information above on your After Visit Summary has been reviewed and is understood.  Full responsibility of the confidentiality of this discharge information lies with  you and/or your care-partner.

## 2021-05-08 NOTE — Progress Notes (Signed)
Called to room to assist during endoscopic procedure.  Patient ID and intended procedure confirmed with present staff. Received instructions for my participation in the procedure from the performing physician.  

## 2021-05-08 NOTE — Progress Notes (Signed)
Report given to PACU, vss 

## 2021-05-12 ENCOUNTER — Telehealth: Payer: Self-pay

## 2021-05-12 NOTE — Telephone Encounter (Signed)
°  Follow up Call-  Call back number 05/08/2021  Post procedure Call Back phone  # [phone]  Permission to leave phone message Yes  Some recent data might be hidden     Patient questions:  Do you have a fever, pain , or abdominal swelling? No. Pain Score  0 *  Have you tolerated food without any problems? Yes.    Have you been able to return to your normal activities? Yes.    Do you have any questions about your discharge instructions: Diet   No. Medications  No. Follow up visit  No.  Do you have questions or concerns about your Care? No.  Actions: * If pain score is 4 or above: No action needed, pain <4.  Have you developed a fever since your procedure? no  2.   Have you had an respiratory symptoms (SOB or cough) since your procedure? no  3.   Have you tested positive for COVID 19 since your procedure no  4.   Have you had any family members/close contacts diagnosed with the COVID 19 since your procedure?  no   If yes to any of these questions please route to Joylene John, RN and Joella Prince, RN

## 2021-05-18 ENCOUNTER — Other Ambulatory Visit: Payer: Self-pay

## 2021-05-18 MED ORDER — MESALAMINE 4 G RE ENEM: 4.0000 g | ENEMA | Freq: Every day | RECTAL | 2 refills | Status: DC

## 2021-06-12 ENCOUNTER — Other Ambulatory Visit: Payer: Self-pay | Admitting: Gastroenterology

## 2021-06-26 ENCOUNTER — Ambulatory Visit: Payer: 59 | Admitting: Gastroenterology

## 2021-07-21 ENCOUNTER — Ambulatory Visit: Payer: 59 | Admitting: Gastroenterology

## 2021-07-21 VITALS — BP 108/68 | HR 101 | Ht 70.0 in | Wt 204.0 lb

## 2021-07-21 DIAGNOSIS — K219 Gastro-esophageal reflux disease without esophagitis: Secondary | ICD-10-CM | POA: Diagnosis not present

## 2021-07-21 DIAGNOSIS — K5150 Left sided colitis without complications: Secondary | ICD-10-CM | POA: Diagnosis not present

## 2021-07-21 NOTE — Patient Instructions (Addendum)
If you are age 46 or older, your body mass index should be between 23-30. Your Body mass index is 29.27 kg/m?Marland Kitchen If this is out of the aforementioned range listed, please consider follow up with your Primary Care Provider. ? ?If you are age 20 or younger, your body mass index should be between 19-25. Your Body mass index is 29.27 kg/m?Marland Kitchen If this is out of the aformentioned range listed, please consider follow up with your Primary Care Provider.  ? ?________________________________________________________ ? ?The [facility] providers would like to encourage you to use [facility] to communicate with providers for non-urgent requests or questions.  Due to long hold times on the telephone, sending your provider a message by [facility] may be a faster and more efficient way to get a response.  Please allow 48 business hours for a response.  Please remember that this is for non-urgent requests.  ?_______________________________________________________ ? ?Please follow up in 4 months. ? ?Thank you for entrusting me with your care and for choosing [facility], ?Dr.  Cellar ? ? ? ? ? ?

## 2021-07-21 NOTE — Progress Notes (Signed)
? ?HPI :  ?Colitis history - diagnosed with left sided UC in 2010. Initially on 4.8gm Lialda daily and then on 2.4gm / day. Earlier in 2020 was transitioned to [facility] due to cost. No prior hospitalizations for colitis. He has been maintained on mesalamine monotherapy for some time. Was on both Canasa and oral mesalamine when colonoscopy showed some mild inflammation Jan 2023 in the setting of a flare. Now on Rowasa and higher dose oral mesalamine ?  ?Since last visit: ?Patient here for a follow-up visit, last seen for colonoscopy in January of this year.  He had some mildly active symptoms despite oral mesalamine last year, added Canasa suppositories.  Colonoscopy showed some active inflammation up to 25 cm from the anal verge.  Canasa was stopped and Rowasa was started, he has continued Apriso 3 g/day since then.  He also added curcumin supplementation and tumeric as well. ? ?He states he noticed significant improvement since switching from [place] to Rowasa.  He has been using Rowasa every day for the most part.  He is not having any blood or mucus in his stools.  No flares or symptoms that are really bother him.  His father unfortunately was diagnosed with cancer and passed away in 08/04/22, he had poor sleep and a lot of stress in his life at that time but he states his colitis did not flare he was happy about that.  He does not have any diarrhea, has some soft bowel movements, rare urgency but not significant at baseline.  No abdominal pains.  We discussed options as below for longer-term management. ? ?Otherwise he has had some weight gain since have seen him.  States GERD bothering him more so since he is gained weight, using Tums as needed.  States he has not eating healthy right now and is working on his diet with goal for weight loss.  He would prefer to not take medicines for reflux. ? ?  ?Prior workup: ?Colonoscopy 07/29/2016 - normal colon, hemorrhoids - path shows normal colon ?  ?Fecal calprotectin  02/23/21 - level of 92 ?CBC and CMET done on 10/28 0 Hgb 15.1, WBC 5.6 ?  ?Colonoscopy 05/08/2021: ?The examined portion of the ileum was normal. ?- Mild (Mayo Score 1) left-sided ulcerative colitis from rectum to 25cm from anal verge. ?Biopsied. ?- Internal hemorrhoids. ?- The examination was otherwise normal. ?- Biopsies were taken with a cold forceps for histology in the descending colon. ?Unfortunately the patient has persistent mildly active disease despite higher dose oral ?mesalamine and Canasa. Can consider switching Canasa to Rowasa enemas to get more ?proximal covered but not convinced this will work to keep him in remission. Will need to ?discuss biologic therapy, will discuss options with the patient. ? ?1. Surgical [P], colon, descending ?BENIGN COLONIC MUCOSA WITH NO DIAGNOSTIC ABNORMALITY ?2. Surgical [P], colon, sigmoid ?CHRONIC, MILDLY ACTIVE COLITIS WITH CRYPTITIS AND SMALL CRYPT ABSCESSES ?NEGATIVE FOR DYSPLASIA AND GRANULOMAS ?3. Surgical [P], colon, rectum ?COMPATIBLE WITH QUIESCENT COLITIS ?NEGATIVE FOR ACTIVITY, DYSPLASIA AND GRANULOMAS ? ?2. The sigmoid colon biopsy shows colonic mucosa with architectural distortion in the form of dilated branching and ?budding crypts within the lamina propria showing an increase to focally mixed mononuclear cell infiltrate. ?Neutrophils focally infiltrate crypt epithelium and there are scattered small crypt abscesses. There is focal ?foreshortening with a focal basal plasmacytosis. Paneth cell metaplasia is present. No granulomas are identified. ?There is no evidence of dysplasia ?3. The rectal biopsy shows colonic mucosa with architectural distortion in the  form of elongated dilated tortuous and ?branching crypts within the lamina propria exhibiting the normal complement of mononuclear cells. There is also ?prominent lymphoid aggregate. No activity is seen. No granulomas are evident. There is no evidence of dysplasia ? ? ? ?Past Medical History:  ?Diagnosis  Date  ? COVID-19   ? Fatty liver   ? GERD (gastroesophageal reflux disease)   ? Ulcerative colitis   ? ? ? ?Past Surgical History:  ?Procedure Laterality Date  ? COLONOSCOPY    ? VASECTOMY    ? ?Family History  ?Problem Relation Age of Onset  ? Diabetes Mother   ? Heart disease Mother   ? Diabetes Father   ? Heart disease Maternal Uncle   ? Colon cancer Neg Hx   ? Colon polyps Neg Hx   ? Esophageal cancer Neg Hx   ? Rectal cancer Neg Hx   ? Stomach cancer Neg Hx   ? ?Social History  ? ?Tobacco Use  ? Smoking status: Former  ?  Types: Cigarettes  ?  Quit date: 04/19/1998  ?  Years since quitting: 23.2  ? Smokeless tobacco: Never  ?Vaping Use  ? Vaping Use: Never used  ?Substance Use Topics  ? Alcohol use: No  ? Drug use: No  ? ?Current Outpatient Medications  ?Medication Sig Dispense Refill  ? AMBULATORY NON FORMULARY MEDICATION Medication Name: curcumin supplement: Take 2 to 3 grams daily    ? MAGNESIUM MALATE PO Take 425 mg by mouth daily.    ? mesalamine (ROWASA) 4 g enema Place 60 mLs (4 g total) rectally at bedtime. 1800 mL 2  ? Multiple Vitamin (MULTIVITAMIN) tablet Take 1 tablet by mouth daily.    ? Probiotic Product (PROBIOTIC PO) Take 1 tablet by mouth daily.    ? TURMERIC PO Take 1 capsule by mouth daily.    ? ?No current facility-administered medications for this visit.  ? ?Allergies  ?Allergen Reactions  ? Penicillins   ?  Unsure  of reaction- was told to not take PCN per mother  ? ? ? ?Review of Systems: ?All systems reviewed and negative except where noted in HPI.  ? ? ?Lab Results  ?Component Value Date  ? WBC 5.6 02/13/2021  ? HGB 15.1 02/13/2021  ? HCT 45.3 02/13/2021  ? MCV 90.6 02/13/2021  ? PLT 273.0 02/13/2021  ? ? ?Lab Results  ?Component Value Date  ? CREATININE 0.94 02/13/2021  ? BUN 8 02/13/2021  ? NA 141 02/13/2021  ? K 4.4 02/13/2021  ? CL 106 02/13/2021  ? CO2 28 02/13/2021  ? ? ?Lab Results  ?Component Value Date  ? ALT 21 02/13/2021  ? AST 18 02/13/2021  ? ALKPHOS 70 02/13/2021  ? BILITOT  0.7 02/13/2021  ? ? ? ?Physical Exam: ?BP 108/68   Pulse (!) 101   Ht 5' 10"  (1.778 m)   Wt 204 lb (92.5 kg)   SpO2 97%   BMI 29.27 kg/m?  ?Constitutional: Pleasant,well-developed, male in no acute distress. ?Neurological: Alert and oriented to person place and time. ?Psychiatric: Normal mood and affect. Behavior is normal. ? ? ?ASSESSMENT AND PLAN: ?46 year old male here for reassessment following: ? ?Left-sided ulcerative colitis ?GERD ? ?As above, colonoscopy showed some active left-sided colitis that was mild in January.  We switched Canasa suppositories to Rowasa enemas while continuing Apriso 3 g/day.  He subjectively has noticed significant improvement since adding Rowasa, feels like it is working much better than the [place] did for him.  He has not had any flares of disease and generally feeling well.  We discussed goals of therapy are to maintain clinical remission and also hope for endoscopic remission to reduce his risk for colon cancer.  He has since also added curcumin/turmeric to his diet.  We discussed long-term, if he wanted to continue oral and rectal mesalamine therapy or if he wanted to consider biologic therapy in hopes of getting off the enema therapy if possible.  We discussed biologic options, discussed risks of anti-TNF's, Stelara, Entyvio etc.  If he is going to switch therapy I would recommend Entyvio given its excellent safety profile and good benefit with colonic disease.  He is interested in [place], has questions about cost, he will contact his insurance company to see if this to be covered and how much it would be for him.  He will continue Rowasa for now and oral mesalamine.  He may try tapering down off the oral mesalamine to see if he continues to feel well on just Rowasa therapy.  He will continue curcumin as well.  For now he wants to continue his current regimen and think more about his options.  I will see him in 4 months for follow-up.  Otherwise we discussed his GERD,  he thinks likely due to diet and some weight gain, will work on diet and weight loss, declines medication for reflux at this time ? ?Plan: ?- will continue both oral and rectal mesalamine (Rowasa) ?- continue curc

## 2021-08-04 ENCOUNTER — Other Ambulatory Visit: Payer: Self-pay | Admitting: Gastroenterology

## 2021-09-07 ENCOUNTER — Other Ambulatory Visit: Payer: Self-pay | Admitting: Gastroenterology

## 2021-11-09 ENCOUNTER — Encounter: Payer: Self-pay | Admitting: Gastroenterology

## 2021-11-27 ENCOUNTER — Other Ambulatory Visit: Payer: Self-pay | Admitting: Gastroenterology

## 2022-03-01 ENCOUNTER — Ambulatory Visit: Payer: 59 | Admitting: Gastroenterology

## 2022-03-01 ENCOUNTER — Encounter: Payer: Self-pay | Admitting: Gastroenterology

## 2022-03-01 ENCOUNTER — Other Ambulatory Visit: Payer: 59

## 2022-03-01 VITALS — BP 120/70 | HR 96 | Ht 70.0 in | Wt 207.0 lb

## 2022-03-01 DIAGNOSIS — K5150 Left sided colitis without complications: Secondary | ICD-10-CM

## 2022-03-01 MED ORDER — MESALAMINE ER 0.375 G PO CP24: 1.5000 g | ORAL_CAPSULE | Freq: Every day | ORAL | 3 refills | Status: DC

## 2022-03-01 MED ORDER — MESALAMINE 4 G RE ENEM: 4.0000 g | ENEMA | Freq: Every day | RECTAL | 3 refills | Status: DC

## 2022-03-01 NOTE — Patient Instructions (Addendum)
If you are age 46 or older, your body mass index should be between 23-30. Your Body mass index is 29.7 kg/m. If this is out of the aforementioned range listed, please consider follow up with your Primary Care Provider.  If you are age 2 or younger, your body mass index should be between 19-25. Your Body mass index is 29.7 kg/m. If this is out of the aformentioned range listed, please consider follow up with your Primary Care Provider.   ________________________________________________________   Please go to the lab in the basement of our building to have lab work done as you leave today. Hit "B" for basement when you get on the elevator.  When the doors open the lab is on your left.  We will call you with the results. Thank you.   We have sent the following medications to your pharmacy for you to pick up at your convenience: Dorthy Cooler and Rowasa  Thank you for entrusting me with your care and for choosing [facility], Dr. [place] Cellar

## 2022-03-01 NOTE — Progress Notes (Signed)
HPI :  Colitis history - diagnosed with left sided UC in 2010. Initially on 4.8gm Lialda daily and then on 2.4gm / day. Earlier in 2020 was transitioned to [facility] due to cost. No prior hospitalizations for colitis. He has been maintained on mesalamine monotherapy for some time. Was on both Canasa and oral mesalamine when colonoscopy showed some mild inflammation Jan 2023 in the setting of a flare. Now on Rowasa and higher dose oral mesalamine   Since last visit: 46 year old male here for a follow-up visit.  I last saw him in April of this year.  Recall earlier in January of this year he had some mildly active symptoms despite oral mesalamine and Canasa suppositories.  Colonoscopy showed some active inflammation up to 25 cm from the anal verge.  Canasa was stopped and Rowasa was started, he has continued Apriso 3 g/day since then.  He also added curcumin supplementation and tumeric as well.   Generally has been doing pretty well since of last seen him.  He has not had any flares of his colitis that have impaired his functioning.  He denies any blood or mucus in his stools.  Occasionally he has some mild urgency but no incontinence.  He has roughly 3-4 bowel movements per day, denies any significant diarrhea.  He has been compliant with Rowasa usually about 6 days/week and he thinks that is definitely helped him compared to the [place], he can tell a difference.  Since he has been doing better he has reduced the Apriso to 4 tablets daily.  He states he continues to feel pretty well and takes this in conjunction with curcumin supplementation.  He tries to eat very healthy diet and avoid foods that may trigger his symptoms.  He feels that when he exercises more his colon health is generally better as well.  He has not been able to exercise nearly as much as he would like and is working on lifestyle changes for that.  He has a very busy job.  Generally he is doing pretty well otherwise.  We discussed if he  wanted to consider other biologic therapies if taking the Rowasa was too much of a hassle for him to use on a routine basis etc.  He has not had lab work or fecal calprotectin since last year.   Prior workup: Colonoscopy 07/29/2016 - normal colon, hemorrhoids - path shows normal colon   Fecal calprotectin 02/23/21 - level of 92 CBC and CMET done on 10/28 0 Hgb 15.1, WBC 5.6   Colonoscopy 05/08/2021: The examined portion of the ileum was normal. - Mild (Mayo Score 1) left-sided ulcerative colitis from rectum to 25cm from anal verge. Biopsied. - Internal hemorrhoids. - The examination was otherwise normal. - Biopsies were taken with a cold forceps for histology in the descending colon. Unfortunately the patient has persistent mildly active disease despite higher dose oral mesalamine and Canasa. Can consider switching Canasa to Rowasa enemas to get more proximal covered but not convinced this will work to keep him in remission. Will need to discuss biologic therapy, will discuss options with the patient.   1. Surgical [P], colon, descending BENIGN COLONIC MUCOSA WITH NO DIAGNOSTIC ABNORMALITY 2. Surgical [P], colon, sigmoid CHRONIC, MILDLY ACTIVE COLITIS WITH CRYPTITIS AND SMALL CRYPT ABSCESSES NEGATIVE FOR DYSPLASIA AND GRANULOMAS 3. Surgical [P], colon, rectum COMPATIBLE WITH QUIESCENT COLITIS NEGATIVE FOR ACTIVITY, DYSPLASIA AND GRANULOMAS   2. The sigmoid colon biopsy shows colonic mucosa with architectural distortion in the form of dilated  branching and budding crypts within the lamina propria showing an increase to focally mixed mononuclear cell infiltrate. Neutrophils focally infiltrate crypt epithelium and there are scattered small crypt abscesses. There is focal foreshortening with a focal basal plasmacytosis. Paneth cell metaplasia is present. No granulomas are identified. There is no evidence of dysplasia 3. The rectal biopsy shows colonic mucosa with architectural distortion  in the form of elongated dilated tortuous and branching crypts within the lamina propria exhibiting the normal complement of mononuclear cells. There is also prominent lymphoid aggregate. No activity is seen. No granulomas are evident. There is no evidence of dysplasia    Past Medical History:  Diagnosis Date   COVID-19    Fatty liver    GERD (gastroesophageal reflux disease)    Ulcerative colitis      Past Surgical History:  Procedure Laterality Date   COLONOSCOPY     VASECTOMY     Family History  Problem Relation Age of Onset   Diabetes Mother    Heart disease Mother    Diabetes Father    Heart disease Maternal Uncle    Colon cancer Neg Hx    Colon polyps Neg Hx    Esophageal cancer Neg Hx    Rectal cancer Neg Hx    Stomach cancer Neg Hx    Social History   Tobacco Use   Smoking status: Former    Types: Cigarettes    Quit date: 04/19/1998    Years since quitting: 23.8   Smokeless tobacco: Never  Vaping Use   Vaping Use: Never used  Substance Use Topics   Alcohol use: No   Drug use: No   Current Outpatient Medications  Medication Sig Dispense Refill   AMBULATORY NON FORMULARY MEDICATION Medication Name: curcumin supplement: Take 2 to 3 grams daily     MAGNESIUM MALATE PO Take 425 mg by mouth daily.     mesalamine (APRISO) 0.375 g 24 hr capsule TAKE 8 CAPSULES (3 G TOTAL) BY MOUTH DAILY. 720 capsule 1   mesalamine (ROWASA) 4 g enema PLACE 60 MLS (4 G TOTAL) RECTALLY AT BEDTIME. 1800 mL 3   Multiple Vitamin (MULTIVITAMIN) tablet Take 1 tablet by mouth daily.     Probiotic Product (PROBIOTIC PO) Take 1 tablet by mouth daily.     TURMERIC PO Take 1 capsule by mouth daily.     No current facility-administered medications for this visit.   Allergies  Allergen Reactions   Penicillins     Unsure  of reaction- was told to not take PCN per mother     Review of Systems: All systems reviewed and negative except where noted in HPI.    Lab Results  Component  Value Date   WBC 5.6 02/13/2021   HGB 15.1 02/13/2021   HCT 45.3 02/13/2021   MCV 90.6 02/13/2021   PLT 273.0 02/13/2021   Lab Results  Component Value Date   CREATININE 0.94 02/13/2021   BUN 8 02/13/2021   NA 141 02/13/2021   K 4.4 02/13/2021   CL 106 02/13/2021   CO2 28 02/13/2021    Lab Results  Component Value Date   ALT 21 02/13/2021   AST 18 02/13/2021   ALKPHOS 70 02/13/2021   BILITOT 0.7 02/13/2021     Physical Exam: BP 120/70   Pulse 96   Ht 5' 10"  (1.778 m)   Wt 207 lb (93.9 kg)   SpO2 97%   BMI 29.70 kg/m  Constitutional: Pleasant,well-developed, male in no acute distress.  Neurological: Alert and oriented to person place and time. Psychiatric: Normal mood and affect. Behavior is normal.   ASSESSMENT: 46 y.o. male here for assessment of the following  1. Left sided ulcerative (chronic) colitis ([place])    Longstanding left-sided colitis.  Previously on oral mesalamine and Canasa had some mildly active inflammation with some mild symptoms.  His fecal calprotectin last year was borderline elevated.  We have since switched his Canasa to Rowasa enemas and in general that measures alone has helped him quite a bit and he is feeling much better in this regard.  He has titrated down his Apriso to 4 tabs per day.  He has also been using curcumin.    We discussed options to treat his colitis in general, namely if he ever wanted to escalate to a biologic therapy such as Entyvio which may be a lot easier than using enemas every night etc.  He is considering this although is concerned about the cost of his Weyman Rodney which he believes was rather expensive we last looked into this about a year ago.  He is content with his current regimen albeit a lot to do on a daily basis for him.  We will check his fecal calprotectin to see where that is trending, he is also due for basic lab work today with a CBC and CMET.  We will await his labs and if they look normal I think his preference  is to continue his current regimen.  If he wishes to pursue biologic therapy at some point, if his insurance covers Entyvio that would be the preference given favorable safety profile.  Otherwise continue his medications, refilled them, follow-up at least once yearly.  PLAN: - lab for fecal calprotectin - lab for CBC, CMET - continue Apriso, Rowasa - refilled - continue curcumin - discussed alternatives if he does not want to use long term Rowasa, up to him if he wants to pursue biologic, if he does would pursue Entyvio (safety profile)  Jolly Mango, MD [facility]

## 2022-03-02 ENCOUNTER — Other Ambulatory Visit (INDEPENDENT_AMBULATORY_CARE_PROVIDER_SITE_OTHER): Payer: 59

## 2022-03-02 DIAGNOSIS — K5150 Left sided colitis without complications: Secondary | ICD-10-CM

## 2022-03-02 LAB — COMPREHENSIVE METABOLIC PANEL
ALT: 28 U/L (ref 0–53)
AST: 24 U/L (ref 0–37)
Albumin: 4.7 g/dL (ref 3.5–5.2)
Alkaline Phosphatase: 61 U/L (ref 39–117)
BUN: 11 mg/dL (ref 6–23)
CO2: 30 mEq/L (ref 19–32)
Calcium: 9.8 mg/dL (ref 8.4–10.5)
Chloride: 103 mEq/L (ref 96–112)
Creatinine, Ser: 0.94 mg/dL (ref 0.40–1.50)
GFR: 97.41 mL/min (ref >60.00)
Glucose, Bld: 105 mg/dL — ABNORMAL HIGH (ref 70–99)
Potassium: 3.5 mEq/L (ref 3.5–5.1)
Sodium: 140 mEq/L (ref 135–145)
Total Bilirubin: 0.5 mg/dL (ref 0.2–1.2)
Total Protein: 7.3 g/dL (ref 6.0–8.3)

## 2022-03-02 LAB — CBC WITH DIFFERENTIAL/PLATELET
Basophils Absolute: 0.1 10*3/uL (ref 0.0–0.1)
Basophils Relative: 1 % (ref 0.0–3.0)
Eosinophils Absolute: 0.2 10*3/uL (ref 0.0–0.7)
Eosinophils Relative: 2.5 % (ref 0.0–5.0)
HCT: 44.2 % (ref 39.0–52.0)
Hemoglobin: 14.7 g/dL (ref 13.0–17.0)
Lymphocytes Relative: 32.6 % (ref 12.0–46.0)
Lymphs Abs: 2.3 10*3/uL (ref 0.7–4.0)
MCHC: 33.3 g/dL (ref 30.0–36.0)
MCV: 91.2 fl (ref 78.0–100.0)
Monocytes Absolute: 0.5 10*3/uL (ref 0.1–1.0)
Monocytes Relative: 7 % (ref 3.0–12.0)
Neutro Abs: 4 10*3/uL (ref 1.4–7.7)
Neutrophils Relative %: 56.9 % (ref 43.0–77.0)
Platelets: 274 10*3/uL (ref 150.0–400.0)
RBC: 4.85 Mil/uL (ref 4.22–5.81)
RDW: 13.3 % (ref 11.5–15.5)
WBC: 7.1 10*3/uL (ref 4.0–10.5)

## 2022-03-05 LAB — CALPROTECTIN, FECAL: Calprotectin, Fecal: <5 ug/g (ref 0–120)

## 2022-03-06 ENCOUNTER — Other Ambulatory Visit: Payer: Self-pay | Admitting: Gastroenterology

## 2023-04-22 ENCOUNTER — Other Ambulatory Visit: Payer: Self-pay | Admitting: Gastroenterology

## 2023-04-25 ENCOUNTER — Other Ambulatory Visit: Payer: Self-pay | Admitting: Gastroenterology

## 2023-05-26 ENCOUNTER — Other Ambulatory Visit: Payer: Self-pay | Admitting: Gastroenterology

## 2023-06-29 ENCOUNTER — Other Ambulatory Visit: Payer: Self-pay | Admitting: Gastroenterology

## 2023-06-30 ENCOUNTER — Telehealth: Payer: Self-pay | Admitting: Gastroenterology

## 2023-06-30 NOTE — Telephone Encounter (Signed)
 I would do CBC, CMET and stool for fecal calprotectin.  Up to him if he wants to wait for the visit first or do it beforehand.  Thanks

## 2023-07-01 ENCOUNTER — Other Ambulatory Visit: Payer: Self-pay

## 2023-07-01 ENCOUNTER — Other Ambulatory Visit: Payer: Self-pay | Admitting: Gastroenterology

## 2023-07-01 DIAGNOSIS — K5150 Left sided colitis without complications: Secondary | ICD-10-CM

## 2023-07-01 NOTE — Telephone Encounter (Signed)
 Contacted patient and informed him that he could wait before his appointment or wait until his appointment to get labs. Patient stated that he preferred to do labs before his appointment.

## 2023-07-06 ENCOUNTER — Other Ambulatory Visit: Payer: Self-pay | Admitting: Gastroenterology

## 2023-07-06 ENCOUNTER — Other Ambulatory Visit (INDEPENDENT_AMBULATORY_CARE_PROVIDER_SITE_OTHER)

## 2023-07-06 ENCOUNTER — Encounter: Payer: Self-pay | Admitting: Gastroenterology

## 2023-07-06 DIAGNOSIS — K5150 Left sided colitis without complications: Secondary | ICD-10-CM

## 2023-07-06 LAB — CBC WITH DIFFERENTIAL/PLATELET
Basophils Absolute: 0.1 10*3/uL (ref 0.0–0.1)
Basophils Relative: 0.9 % (ref 0.0–3.0)
Eosinophils Absolute: 0.2 10*3/uL (ref 0.0–0.7)
Eosinophils Relative: 2.2 % (ref 0.0–5.0)
HCT: 46.8 % (ref 39.0–52.0)
Hemoglobin: 15.9 g/dL (ref 13.0–17.0)
Lymphocytes Relative: 21.8 % (ref 12.0–46.0)
Lymphs Abs: 1.6 10*3/uL (ref 0.7–4.0)
MCHC: 33.9 g/dL (ref 30.0–36.0)
MCV: 91.6 fl (ref 78.0–100.0)
Monocytes Absolute: 0.5 10*3/uL (ref 0.1–1.0)
Monocytes Relative: 7.5 % (ref 3.0–12.0)
Neutro Abs: 5 10*3/uL (ref 1.4–7.7)
Neutrophils Relative %: 67.6 % (ref 43.0–77.0)
Platelets: 276 10*3/uL (ref 150.0–400.0)
RBC: 5.11 Mil/uL (ref 4.22–5.81)
RDW: 13.1 % (ref 11.5–15.5)
WBC: 7.4 10*3/uL (ref 4.0–10.5)

## 2023-07-06 LAB — COMPREHENSIVE METABOLIC PANEL
ALT: 21 U/L (ref 0–53)
AST: 18 U/L (ref 0–37)
Albumin: 4.9 g/dL (ref 3.5–5.2)
Alkaline Phosphatase: 59 U/L (ref 39–117)
BUN: 11 mg/dL (ref 6–23)
CO2: 27 meq/L (ref 19–32)
Calcium: 9.8 mg/dL (ref 8.4–10.5)
Chloride: 103 meq/L (ref 96–112)
Creatinine, Ser: 0.97 mg/dL (ref 0.40–1.50)
GFR: 92.93 mL/min (ref >60.00)
Glucose, Bld: 107 mg/dL — ABNORMAL HIGH (ref 70–99)
Potassium: 4.1 meq/L (ref 3.5–5.1)
Sodium: 138 meq/L (ref 135–145)
Total Bilirubin: 0.6 mg/dL (ref 0.2–1.2)
Total Protein: 7.5 g/dL (ref 6.0–8.3)

## 2023-07-20 ENCOUNTER — Telehealth: Payer: Self-pay | Admitting: Gastroenterology

## 2023-07-20 MED ORDER — MESALAMINE ER 0.375 G PO CP24: 1.5000 g | ORAL_CAPSULE | Freq: Every day | ORAL | 0 refills | Status: DC

## 2023-07-20 MED ORDER — MESALAMINE 4 G RE ENEM: 4.0000 g | ENEMA | Freq: Every day | RECTAL | 0 refills | Status: DC

## 2023-07-20 NOTE — Telephone Encounter (Signed)
 1 week script for Rowasa and [facility] [place] per patient request

## 2023-07-20 NOTE — Telephone Encounter (Signed)
 PT is calling to ask if we can send a weeks worth of both his [facility]alamine prescriptions [facility] [place]. [facility] is [address] [place], [place] [address]. He still has his OV scheduled for 4/25

## 2023-08-12 ENCOUNTER — Ambulatory Visit: Admitting: Nurse Practitioner

## 2023-08-12 ENCOUNTER — Encounter: Payer: Self-pay | Admitting: Nurse Practitioner

## 2023-08-12 VITALS — BP 104/60 | HR 91 | Ht 70.5 in | Wt 205.0 lb

## 2023-08-12 DIAGNOSIS — K5150 Left sided colitis without complications: Secondary | ICD-10-CM

## 2023-08-12 MED ORDER — MESALAMINE 4 G RE ENEM: 4.0000 g | ENEMA | Freq: Every day | RECTAL | 3 refills | Status: AC

## 2023-08-12 NOTE — Progress Notes (Signed)
 08/12/2023 Gehrig Ofarrell [id number] 1975/12/12   Chief Complaint: Ulcerative colitis follow up  History of Present Illness: Antron Meras is a 48 year old male with a history of left-sided ulcerative colitis initially diagnosed in 2010, previously on Lialda  then switched to Apriso  secondary to cost in 2020 and subsequently started on Mesalamine  enemas after his colonoscopy 04/2021 showed persistent mildly active disease. He presents today for routine follow-up. He denies having any abdominal pain. He is passing one to two mostly formed stools daily, every once in a while has increased urgency with loose stools and some constipation with small hard stools. No blood or mucous per the rectum. Rare tenesmus, occurs once or twice in the last year.  He is adherent with taking Mesalamine  0.3725 mg 4 capsules daily and Mesalamine  4 g enema nightly.  He stated biological therapy was not an option for him due to high out-of-pocket cost as previously reviewed with his insurance carrier.  He avoids NSAIDs.  His most recent laboratory studies 07/06/2023 showed a WBC count of 7.4.  Hemoglobin 15.9.  Normal LFTs.     Latest Ref Rng & Units 07/06/2023   11:18 AM 03/02/2022    1:23 PM 02/13/2021    9:58 AM  CBC  WBC 4.0 - 10.5 K/uL 7.4  7.1  5.6   Hemoglobin 13.0 - 17.0 g/dL 81.1  91.4  78.2   Hematocrit 39.0 - 52.0 % 46.8  44.2  45.3   Platelets 150.0 - 400.0 K/uL 276.0  274.0  273.0        Latest Ref Rng & Units 07/06/2023   11:18 AM 03/02/2022    1:23 PM 02/13/2021    9:58 AM  CMP  Glucose 70 - 99 mg/dL 956  213  93   BUN 6 - 23 mg/dL 11  11  8    Creatinine 0.40 - 1.50 mg/dL 0.86  5.78  4.69   Sodium 135 - 145 mEq/L 138  140  141   Potassium 3.5 - 5.1 mEq/L 4.1  3.5  4.4   Chloride 96 - 112 mEq/L 103  103  106   CO2 19 - 32 mEq/L 27  30  28    Calcium 8.4 - 10.5 mg/dL 9.8  9.8  9.3   Total Protein 6.0 - 8.3 g/dL 7.5  7.3  7.3   Total Bilirubin 0.2 - 1.2 mg/dL 0.6  0.5  0.7   Alkaline Phos 39 - 117  U/L 59  61  70   AST 0 - 37 U/L 18  24  18    ALT 0 - 53 U/L 21  28  21      GI PROCEDURES:  Colonoscopy 05/08/2021: The examined portion of the ileum was normal. - Mild (Mayo Score 1) left-sided ulcerative colitis from rectum to 25cm from anal verge. Biopsied. - Internal hemorrhoids. - The examination was otherwise normal. - Biopsies were taken with a cold forceps for histology in the descending colon. Unfortunately the patient has persistent mildly active disease despite higher dose oral mesalamine  and Canasa . Can consider switching Canasa  to Rowasa  enemas to get more proximal covered but not convinced this will work to keep him in remission. Will need to discuss biologic therapy, will discuss options with the patient.   1. Surgical [P], colon, descending BENIGN COLONIC MUCOSA WITH NO DIAGNOSTIC ABNORMALITY 2. Surgical [P], colon, sigmoid CHRONIC, MILDLY ACTIVE COLITIS WITH CRYPTITIS AND SMALL CRYPT ABSCESSES NEGATIVE FOR DYSPLASIA AND GRANULOMAS 3. Surgical order, colon, rectum COMPATIBLE WITH QUIESCENT COLITIS  NEGATIVE FOR ACTIVITY, DYSPLASIA AND GRANULOMAS  2. The sigmoid colon biopsy shows colonic mucosa with architectural distortion in the form of dilated branching and budding crypts within the lamina propria showing an increase to focally mixed mononuclear cell infiltrate. Neutrophils focally infiltrate crypt epithelium and there are scattered small crypt abscesses. There is focal foreshortening with a focal basal plasmacytosis. Paneth cell metaplasia is present. No granulomas are identified. There is no evidence of dysplasia 3. The rectal biopsy shows colonic mucosa with architectural distortion in the form of elongated dilated tortuous and branching crypts within the lamina propria exhibiting the normal complement of mononuclear cells. There is also prominent lymphoid aggregate. No activity is seen. No granulomas are evident. There is no evidence of dysplasia   Colonoscopy  07/29/2016 - normal colon, hemorrhoids - path shows normal colon    Current Outpatient Medications on File Prior to Visit  Medication Sig Dispense Refill   mesalamine  (APRISO ) 0.375 g 24 hr capsule Take 4 capsules (1.5 g total) by mouth daily for 7 days. 28 capsule 0   Multiple Vitamin (MULTIVITAMIN) tablet Take 1 tablet by mouth daily.     Probiotic Product (PROBIOTIC PO) Take 1 tablet by mouth daily.     TURMERIC PO Take 1 capsule by mouth daily.     No current facility-administered medications on file prior to visit.   Allergies  Allergen Reactions   Penicillins     Unsure  of reaction- was told to not take PCN per mother    Current Medications, Allergies, Past Medical History, Past Surgical History, Family History and Social History were reviewed in [employer] record.  Review of Systems:   Constitutional: Negative for fever, sweats, chills or weight loss.  Respiratory: Negative for shortness of breath.   Cardiovascular: Negative for chest pain, palpitations and leg swelling.  Gastrointestinal: See HPI.  Musculoskeletal: Negative for back pain or muscle aches.  Neurological: Negative for dizziness, headaches or paresthesias.   Physical Exam: BP 104/60   Pulse 91   Ht 5' 10.5" (1.791 m)   Wt 205 lb (93 kg)   BMI 29.00 kg/m  General: 48 year old male in no acute distress. Head: Normocephalic and atraumatic. Eyes: No scleral icterus. Conjunctiva pink . Ears: Normal auditory acuity. Mouth: Dentition intact. No ulcers or lesions.  Lungs: Clear throughout to auscultation. Heart: Regular rate and rhythm, no murmur. Abdomen: Soft, nontender and nondistended. No masses or hepatomegaly. Normal bowel sounds x 4 quadrants.  Rectal: Deferred. Musculoskeletal: Symmetrical with no gross deformities. Extremities: No edema. Neurological: Alert oriented x 4. No focal deficits.  Psychological: Alert and cooperative. Normal mood and affect  Assessment and  Recommendations:  48 year old male with left-sided ulcerative colitis initially diagnosed in 2010, well-controlled on Mesalamine  (Apriso ) 0.3725 mg 4 capsules daily and Mesalamine  4 g enema nightly.  His most recent colonoscopy 05/08/2021 showed persistent mildly active colitis and rectal biopsies were compatible with quiescent colitis. - Patient to complete fecal calprotectin level as previously ordered - Dr. General Kenner to verify timing of next surveillance colonoscopy - Continue 0.3725 mg 4 capsules daily and Mesalamine  4 g enema nightly.  - Continue to avoid NSAIDs - Diet as tolerated

## 2023-08-12 NOTE — Progress Notes (Signed)
 Agree with assessment and plan as outlined. He has had colitis now for 15 years, where we may do surveillance every few years. I think a colonoscopy anytime within the next year, when he is able to, is reasonable for surveillance. Otherwise agree with fecal calprotectin. Thanks

## 2023-08-12 NOTE — Patient Instructions (Addendum)
 Complete fecal calprotectin stool test as previously ordered.  Continue Mesalamine  0.375mg - take 4 capsules by mouth daily  Next colonoscopy date to be determined after fecal calprotectin stool test result reviewed.  Due to recent changes in healthcare laws, you may see the results of your imaging and laboratory studies on MyChart before your provider has had a chance to review them.  We understand that in some cases there may be results that are confusing or concerning to you. Not all laboratory results come back in the same time frame and the provider may be waiting for multiple results in order to interpret others.  Please give us  48 hours in order for your provider to thoroughly review all the results before contacting the office for clarification of your results.   Thank you for trusting me with your gastrointestinal care!   Everett Hitt, CRNP

## 2023-08-15 ENCOUNTER — Other Ambulatory Visit

## 2023-08-15 ENCOUNTER — Telehealth: Payer: Self-pay | Admitting: *Deleted

## 2023-08-15 DIAGNOSIS — K5150 Left sided colitis without complications: Secondary | ICD-10-CM

## 2023-08-15 MED ORDER — MESALAMINE ER 0.375 G PO CP24: 1.5000 g | ORAL_CAPSULE | Freq: Every day | ORAL | 0 refills | Status: DC

## 2023-08-15 NOTE — Progress Notes (Signed)
 Dottie/Linda, pls contact patient and let him know that Dr. General Kenner recommend for him to have a colonoscopy within the next year, patient to let contact us  when he is ready to schedule. Patient to complete fecal calprotectin level as previously ordered, if significantly elevated, will consider an earlier colonoscopy. THX.

## 2023-08-15 NOTE — Telephone Encounter (Signed)
 Left message for patient to call back

## 2023-08-15 NOTE — Telephone Encounter (Signed)
 I have spoken with the patient to advise of Dr Tena Feeling recommendation for colonoscopy within the next year, sooner if found to have elevated fecal calprotectin. Patient states that he does have calprotectin kit but has not been able to complete this to date. Will try to do this within the next couple of weeks. Patient also says that he is not sure that he wants to have a colonoscopy every couple of years as per recommendations because his insurance will not cover at 100% and he does not feel that he should have to pay for colonoscopy as he has ulcerative colitis.

## 2023-08-15 NOTE — Telephone Encounter (Addendum)
 Author: Tory Freiberg, NP Service: Gastroenterology Author Type: Nurse Practitioner  Filed: 08/15/2023  8:52 AM Encounter Date: 08/12/2023 Status: Signed  Editor: Tory Freiberg, NP (Nurse Practitioner)   Dottie/Linda, pls contact patient and let him know that Dr. General Kenner recommend for him to have a colonoscopy within the next year, patient to let contact us  when he is ready to schedule. Patient to complete fecal calprotectin level as previously ordered, if significantly elevated, will consider an earlier colonoscopy. THX.        ----- Message from Tory Freiberg sent at 08/15/2023  8:51 AM EDT -----    ----- Message ----- From: Ace Holder, MD Sent: 08/12/2023   5:55 PM EDT To: Tory Freiberg, NP     ----- Message ----- From: Tory Freiberg, NP Sent: 08/12/2023   5:22 PM EDT To: Ace Holder, MD

## 2023-08-17 ENCOUNTER — Encounter: Payer: Self-pay | Admitting: Gastroenterology

## 2023-08-17 LAB — CALPROTECTIN, FECAL: Calprotectin, Fecal: 8 ug/g (ref 0–120)

## 2023-10-08 ENCOUNTER — Other Ambulatory Visit: Payer: Self-pay | Admitting: Nurse Practitioner

## 2023-10-20 ENCOUNTER — Telehealth: Payer: Self-pay | Admitting: Gastroenterology

## 2023-10-20 NOTE — Telephone Encounter (Signed)
 Inbound call from patient, states he would like [facility], patient states he was in [place] and now pharmacy states they don't have prescription.

## 2023-10-20 NOTE — Telephone Encounter (Signed)
 [facility]  [facility] do [facility] patient's prescriptions for mesalamine  oral and enemas it's just that patient has picked up a 90 day supply of both within the last 90 days so it's too soon for him to pick up more until the end of the month.  Pharmacy said they would call him and explain.

## 2024-04-20 ENCOUNTER — Other Ambulatory Visit: Payer: Self-pay | Admitting: Nurse Practitioner
